# Patient Record
Sex: Female | Born: 1990 | Race: Black or African American | Hispanic: No | Smoking: Current every day smoker
Health system: Southern US, Community
[De-identification: ages and names within clinical notes are randomized; demographics above are authoritative.]

## PROBLEM LIST (undated history)

## (undated) DIAGNOSIS — Z789 Other specified health status: Secondary | ICD-10-CM

---

## 2011-08-06 DIAGNOSIS — Z6836 Body mass index (BMI) 36.0-36.9, adult: Secondary | ICD-10-CM | POA: Insufficient documentation

## 2015-04-20 DIAGNOSIS — Z880 Allergy status to penicillin: Secondary | ICD-10-CM | POA: Insufficient documentation

## 2015-04-29 DIAGNOSIS — R768 Other specified abnormal immunological findings in serum: Secondary | ICD-10-CM | POA: Insufficient documentation

## 2016-10-12 ENCOUNTER — Encounter: Payer: Self-pay | Admitting: Certified Nurse Midwife

## 2016-10-20 ENCOUNTER — Encounter: Payer: Medicaid Other | Admitting: Medical

## 2016-11-15 ENCOUNTER — Encounter: Payer: Self-pay | Admitting: *Deleted

## 2016-11-15 ENCOUNTER — Encounter: Payer: Medicaid Other | Admitting: Medical

## 2016-11-15 NOTE — Progress Notes (Signed)
Judy Quinn did not keep her scheduled appointment for birth control. Per discussion with Vonzella NippleJulie Wenzel, PA she  does not need to be called, may reschedule if she calls.

## 2016-12-19 ENCOUNTER — Encounter: Payer: Medicaid Other | Admitting: Medical

## 2017-02-12 ENCOUNTER — Ambulatory Visit: Payer: Medicaid Other | Admitting: Podiatry

## 2017-02-12 DIAGNOSIS — M79676 Pain in unspecified toe(s): Secondary | ICD-10-CM

## 2017-02-12 DIAGNOSIS — L603 Nail dystrophy: Secondary | ICD-10-CM

## 2017-02-12 DIAGNOSIS — B351 Tinea unguium: Secondary | ICD-10-CM | POA: Diagnosis not present

## 2017-02-12 MED ORDER — TERBINAFINE HCL 250 MG PO TABS: 250.0000 mg | ORAL_TABLET | Freq: Every day | ORAL | 0 refills | Status: DC

## 2017-02-12 NOTE — Progress Notes (Signed)
   Subjective:    Patient ID: Judy Quinn, female    DOB: 1990/03/11, 27 y.o.   MRN: [id number]  HPI    Review of Systems  All other systems reviewed and are negative.      Objective:   Physical Exam        Assessment & Plan:

## 2017-02-14 NOTE — Progress Notes (Signed)
   Subjective: 27 year old female presenting today as a new patient with a chief complaint of thickening and discoloration of the right great toenail that began about one month ago. She reports associated pain and tenderness of the nail. She states she had an acrylic nail applied to the toenail and believes part of it may still be present. Wearing heels increases the pain. She has not done anything to treat the symptoms. Patient presents today for further treatment and evaluation.  No past medical history on file.  Objective: Physical Exam General: The patient is alert and oriented x3 in no acute distress.  Dermatology: Hyperkeratotic, discolored, thickened, onychodystrophy of the right great toenail. Skin is warm, dry and supple bilateral lower extremities. Negative for open lesions or macerations.  Vascular: Palpable pedal pulses bilaterally. No edema or erythema noted. Capillary refill within normal limits.  Neurological: Epicritic and protective threshold grossly intact bilaterally.   Musculoskeletal Exam: Range of motion within normal limits to all pedal and ankle joints bilateral. Muscle strength 5/5 in all groups bilateral.   Assessment: #1 dystrophic nail right great toe #2 Onychomycosis of nail due to dermatophyte right great toe #3 Pain in right foot  Plan of Care:  #1 Patient was evaluated. #2 Mechanical debridement of right great toenail performed using a nail nipper. Filed with dremel without incident.  #3 Prescription for Lamisil 250 mg #90 provided to patient. #4 Explained that it may take up to one year for nail to grow out.  #5 Return to clinic as needed.   Felecia ShellingBrent M. Maranda Marte, DPM [facility]  Dr. Felecia ShellingBrent M. Francisco Ostrovsky, DPM    [address]                                        [place], [place] [address]                Office [phone]  Fax [phone]

## 2017-04-10 DIAGNOSIS — H5213 Myopia, bilateral: Secondary | ICD-10-CM | POA: Diagnosis not present

## 2017-04-10 DIAGNOSIS — H52223 Regular astigmatism, bilateral: Secondary | ICD-10-CM | POA: Diagnosis not present

## 2017-04-23 DIAGNOSIS — H04123 Dry eye syndrome of bilateral lacrimal glands: Secondary | ICD-10-CM | POA: Diagnosis not present

## 2017-05-09 ENCOUNTER — Encounter (HOSPITAL_COMMUNITY): Payer: Self-pay | Admitting: Emergency Medicine

## 2017-05-09 ENCOUNTER — Ambulatory Visit (HOSPITAL_COMMUNITY)
Admission: EM | Admit: 2017-05-09 | Discharge: 2017-05-09 | Disposition: A | Discharge status: Home / Self Care | Payer: Medicaid Other | Attending: Family Medicine | Admitting: Family Medicine

## 2017-05-09 DIAGNOSIS — L249 Irritant contact dermatitis, unspecified cause: Secondary | ICD-10-CM | POA: Diagnosis not present

## 2017-05-09 MED ORDER — TRIAMCINOLONE ACETONIDE 0.1 % EX CREA: 1.0000 "application " | TOPICAL_CREAM | Freq: Two times a day (BID) | CUTANEOUS | 1 refills | Status: DC

## 2017-05-09 NOTE — ED Triage Notes (Signed)
Pt sts rash to back of neck

## 2017-05-17 NOTE — ED Provider Notes (Signed)
  [facility]   [id number] 05/09/17 Arrival Time: 1314  ASSESSMENT & PLAN:  1. Irritant contact dermatitis, unspecified trigger     Meds ordered this encounter  Medications  . triamcinolone cream (KENALOG) 0.1 %    Sig: Apply 1 application topically 2 (two) times daily.    Dispense:  30 g    Refill:  1   Unsure of trigger. Questions hair products. Will stop using for now. Will follow up with PCP or here if worsening or failing to improve as anticipated. Reviewed expectations re: course of current medical issues. Questions answered. Outlined signs and symptoms indicating need for more acute intervention. Patient verbalized understanding. After Visit Summary given.   SUBJECTIVE:  Judy Quinn is a 27 y.o. female who presents with a skin complaint.   Location: posterior neck Onset: gradual Duration: a few weeks Pruritic? somewhat at times Painful? No Progression: stable  Drainage? No  Known trigger? questions hair products Contacts with similar? No Recent travel? No  Other associated symptoms: none Therapies tried thus far: none Denies fever. No specific aggravating or alleviating factors reported.   ROS: As per HPI.  OBJECTIVE: Vitals:   05/09/17 1332  BP: (!) 143/95  Pulse: 80  Resp: 18  Temp: 98.1 F (36.7 C)  TempSrc: Oral  SpO2: 100%    General appearance: alert; no distress Lungs: clear to auscultation bilaterally Heart: regular rate and rhythm Extremities: no edema Skin: warm and dry; irritated skin over posterior neck at hairline; appears inflammatory and consistent with a contact dermatitis Psychological: alert and cooperative; normal mood and affect  No Known Allergies   Social History   Socioeconomic History  . Marital status: Single    Spouse name: Not on file  . Number of children: Not on file  . Years of education: Not on file  . Highest education level: Not on file  Occupational History  . Not on file  Social Needs    . Financial resource strain: Not on file  . Food insecurity:    Worry: Not on file    Inability: Not on file  . Transportation needs:    Medical: Not on file    Non-medical: Not on file  Tobacco Use  . Smoking status: Not on file  Substance and Sexual Activity  . Alcohol use: Not on file  . Drug use: Not on file  . Sexual activity: Not on file  Lifestyle  . Physical activity:    Days per week: Not on file    Minutes per session: Not on file  . Stress: Not on file  Relationships  . Social connections:    Talks on phone: Not on file    Gets together: Not on file    Attends religious service: Not on file    Active member of club or organization: Not on file    Attends meetings of clubs or organizations: Not on file    Relationship status: Not on file  . Intimate partner violence:    Fear of current or ex partner: Not on file    Emotionally abused: Not on file    Physically abused: Not on file    Forced sexual activity: Not on file  Other Topics Concern  . Not on file  Social History Narrative  . Not on file   History reviewed. No pertinent family history.    Mardella Layman, MD 05/17/17 [phone]

## 2017-05-24 ENCOUNTER — Ambulatory Visit (HOSPITAL_COMMUNITY)
Admission: EM | Admit: 2017-05-24 | Discharge: 2017-05-24 | Disposition: A | Discharge status: Home / Self Care | Payer: Medicaid Other | Attending: Family Medicine | Admitting: Family Medicine

## 2017-05-24 ENCOUNTER — Encounter (HOSPITAL_COMMUNITY): Payer: Self-pay | Admitting: Family Medicine

## 2017-05-24 DIAGNOSIS — J020 Streptococcal pharyngitis: Secondary | ICD-10-CM

## 2017-05-24 DIAGNOSIS — J029 Acute pharyngitis, unspecified: Secondary | ICD-10-CM | POA: Diagnosis not present

## 2017-05-24 LAB — POCT RAPID STREP A: STREPTOCOCCUS, GROUP A SCREEN (DIRECT): POSITIVE — AB

## 2017-05-24 MED ORDER — AMOXICILLIN 500 MG PO CAPS: 500.0000 mg | ORAL_CAPSULE | Freq: Two times a day (BID) | ORAL | 0 refills | Status: AC

## 2017-05-24 MED ORDER — IBUPROFEN 800 MG PO TABS: 800.0000 mg | ORAL_TABLET | Freq: Three times a day (TID) | ORAL | 0 refills | Status: DC

## 2017-05-24 NOTE — ED Provider Notes (Signed)
[facility]    CSN: [id number] Arrival date & time: 05/24/17  1019     History   Chief Complaint Chief Complaint  Patient presents with  . Sore Throat    HPI Judy Quinn is a 27 y.o. female presenting today for evaluation of sore throat.  Patient has had a sore throat and fever since yesterday.  Symptoms began all of a sudden.  Symptoms have continued to worsen into today.  Is endorsing some mild associated congestion, minimal cough.  Taking Tylenol and ibuprofen for pain.  Denies any nausea or vomiting.  HPI  History reviewed. No pertinent past medical history.  Patient Active Problem List   Diagnosis Date Noted  . 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 Penicillin allergy 04/20/2015  . Body mass index (BMI) of 36.0-36.9 in adult 08/06/2011    History reviewed. No pertinent surgical history.  OB History   None      Home Medications    Prior to Admission medications   Medication Sig Start Date End Date Taking? Authorizing Provider  amoxicillin (AMOXIL) 500 MG capsule Take 1 capsule (500 mg total) by mouth 2 (two) times daily for 10 days. 05/24/17 06/03/17  Janelis Stelzer C, PA-C  ibuprofen (ADVIL,MOTRIN) 800 MG tablet Take 1 tablet (800 mg total) by mouth 3 (three) times daily. 05/24/17   Gustavo Dispenza C, PA-C  terbinafine (LAMISIL) 250 MG tablet Take 1 tablet (250 mg total) by mouth daily. 02/12/17   Felecia Shelling, DPM  triamcinolone cream (KENALOG) 0.1 % Apply 1 application topically 2 (two) times daily. 05/09/17   Mardella Layman, MD    Family History History reviewed. No pertinent family history.  Social History Social History   Tobacco Use  . Smoking status: Never Smoker  . Smokeless tobacco: Never Used  Substance Use Topics  . Alcohol use: Not on file  . Drug use: Not on file     Allergies   Patient has no known allergies.   Review of Systems Review of Systems  Constitutional: Negative for chills, fatigue and fever.  HENT:  Positive for congestion, rhinorrhea, sore throat, trouble swallowing and voice change. Negative for ear pain and sinus pressure.   Respiratory: Negative for cough, chest tightness and shortness of breath.   Cardiovascular: Negative for chest pain.  Gastrointestinal: Negative for abdominal pain, nausea and vomiting.  Musculoskeletal: Negative for myalgias.  Skin: Negative for rash.  Neurological: Negative for dizziness, light-headedness and headaches.     Physical Exam Triage Vital Signs ED Triage Vitals [05/24/17 1103]  Enc Vitals Group     BP (!) 145/78     Pulse Rate (!) 107     Resp 18     Temp 99 F (37.2 C)     Temp src      SpO2 100 %     Weight      Height      Head Circumference      Peak Flow      Pain Score      Pain Loc      Pain Edu?      Excl. in GC?    No data found.  Updated Vital Signs BP (!) 145/78   Pulse (!) 107   Temp 99 F (37.2 C)   Resp 18   LMP 05/10/2017   SpO2 100%   Visual Acuity Right Eye Distance:   Left Eye Distance:   Bilateral Distance:    Right Eye Near:  Left Eye Near:    Bilateral Near:     Physical Exam  Constitutional: She appears well-developed and well-nourished. No distress.  HENT:  Head: Normocephalic and atraumatic.  Bilateral TMs not erythematous, nasal mucosa nonerythematous without rhinorrhea, posterior oropharynx erythematous, moderate tonsillar enlargement bilaterally, no exudate, uvula swelling or deviation.  Eyes: Conjunctivae and EOM are normal.  Neck: Normal range of motion. Neck supple.  Cardiovascular: Normal rate and regular rhythm.  No murmur heard. Pulmonary/Chest: Effort normal and breath sounds normal. No respiratory distress.  Breathing comfortably at rest, CTA BL  Abdominal: Soft. There is no tenderness.  Musculoskeletal: She exhibits no edema.  Neurological: She is alert.  Skin: Skin is warm and dry.  Psychiatric: She has a normal mood and affect.  Nursing note and vitals  reviewed.    UC Treatments / Results  Labs (all labs ordered are listed, but only abnormal results are displayed) Labs Reviewed  POCT RAPID STREP A - Abnormal; Notable for the following components:      Result Value   Streptococcus, Group A Screen (Direct) POSITIVE (*)    All other components within normal limits    EKG None  Radiology No results found.  Procedures Procedures (including critical care time)  Medications Ordered in UC Medications - No data to display  Initial Impression / Assessment and Plan / UC Course  I have reviewed the triage vital signs and the nursing notes.  Pertinent labs & imaging results that were available during my care of the patient were reviewed by me and considered in my medical decision making (see chart for details).     Patient tested positive for strep. No evidence of peritonsillar abscess or retropharyngeal abscess. Patient is nontoxic appearing, no drooling, dysphagia, muffled voice, or tripoding. No trismus.  Amoxicillin prescribed, Tylenol and ibuprofen.  Recommended Zyrtec and Flonase for congestion.Discussed strict return precautions. Patient verbalized understanding and is agreeable with plan.  Final Clinical Impressions(s) / UC Diagnoses   Final diagnoses:  Strep pharyngitis     Discharge Instructions     Sore Throat   Your rapid strep test was positive today. We will treat you for strep throat with an antibiotic. Please take Amoxicillin as prescribed.   Please continue Tylenol or Ibuprofen for fever and pain. May try salt water gargles, cepacol lozenges, throat spray, or OTC cold relief medicine for throat discomfort. If you also have congestion take a daily anti-histamine like Zyrtec, Claritin, and a oral decongestant to help with post nasal drip that may be irritating your throat.   Stay hydrated and drink plenty of fluids to keep your throat coated relieve irritation.     ED Prescriptions    Medication Sig Dispense  Auth. Provider   amoxicillin (AMOXIL) 500 MG capsule Take 1 capsule (500 mg total) by mouth 2 (two) times daily for 10 days. 20 capsule Viktoriya Glaspy C, PA-C   ibuprofen (ADVIL,MOTRIN) 800 MG tablet Take 1 tablet (800 mg total) by mouth 3 (three) times daily. 21 tablet Falyn Rubel, [place] C, PA-C     Controlled Substance Prescriptions [place] Controlled Substance Registry consulted? Not Applicable   Lew Dawes, [place] 05/24/17 2350

## 2017-05-24 NOTE — ED Triage Notes (Signed)
Pt here for sore throat since yesterday.

## 2017-05-24 NOTE — Discharge Instructions (Addendum)

## 2017-08-09 DIAGNOSIS — Z113 Encounter for screening for infections with a predominantly sexual mode of transmission: Secondary | ICD-10-CM | POA: Diagnosis not present

## 2017-08-09 DIAGNOSIS — Z3A13 13 weeks gestation of pregnancy: Secondary | ICD-10-CM | POA: Diagnosis not present

## 2017-08-09 DIAGNOSIS — O3680X9 Pregnancy with inconclusive fetal viability, other fetus: Secondary | ICD-10-CM | POA: Diagnosis not present

## 2017-08-09 DIAGNOSIS — N925 Other specified irregular menstruation: Secondary | ICD-10-CM | POA: Diagnosis not present

## 2017-08-21 DIAGNOSIS — Z3A15 15 weeks gestation of pregnancy: Secondary | ICD-10-CM | POA: Diagnosis not present

## 2017-08-21 DIAGNOSIS — Z3482 Encounter for supervision of other normal pregnancy, second trimester: Secondary | ICD-10-CM | POA: Diagnosis not present

## 2017-08-21 LAB — OB RESULTS CONSOLE RUBELLA ANTIBODY, IGM: RUBELLA: IMMUNE

## 2017-08-21 LAB — OB RESULTS CONSOLE ABO/RH: RH Type: POSITIVE

## 2017-08-21 LAB — OB RESULTS CONSOLE ANTIBODY SCREEN: Antibody Screen: NEGATIVE

## 2017-08-21 LAB — OB RESULTS CONSOLE GC/CHLAMYDIA
CHLAMYDIA, DNA PROBE: NEGATIVE
Gonorrhea: NEGATIVE

## 2017-08-21 LAB — OB RESULTS CONSOLE RPR: RPR: NONREACTIVE

## 2017-08-21 LAB — OB RESULTS CONSOLE HIV ANTIBODY (ROUTINE TESTING): HIV: NONREACTIVE

## 2017-08-21 LAB — OB RESULTS CONSOLE HEPATITIS B SURFACE ANTIGEN: Hepatitis B Surface Ag: NEGATIVE

## 2017-08-23 ENCOUNTER — Other Ambulatory Visit (HOSPITAL_COMMUNITY): Payer: Self-pay | Admitting: Obstetrics and Gynecology

## 2017-08-23 DIAGNOSIS — Z87898 Personal history of other specified conditions: Secondary | ICD-10-CM

## 2017-08-23 DIAGNOSIS — O360922 Maternal care for other rhesus isoimmunization, second trimester, fetus 2: Secondary | ICD-10-CM

## 2017-08-23 DIAGNOSIS — Z363 Encounter for antenatal screening for malformations: Secondary | ICD-10-CM

## 2017-08-23 DIAGNOSIS — Z3A17 17 weeks gestation of pregnancy: Secondary | ICD-10-CM

## 2017-08-29 ENCOUNTER — Encounter (HOSPITAL_COMMUNITY): Payer: Self-pay

## 2017-08-31 ENCOUNTER — Ambulatory Visit (HOSPITAL_COMMUNITY): Payer: Medicaid Other

## 2017-08-31 ENCOUNTER — Encounter (HOSPITAL_COMMUNITY): Payer: Medicaid Other

## 2017-09-03 ENCOUNTER — Encounter (HOSPITAL_COMMUNITY): Payer: Self-pay | Admitting: *Deleted

## 2017-09-05 ENCOUNTER — Ambulatory Visit (HOSPITAL_COMMUNITY)
Admission: RE | Admit: 2017-09-05 | Discharge: 2017-09-05 | Disposition: A | Discharge status: Home / Self Care | Payer: Medicaid Other | Source: Ambulatory Visit | Attending: Obstetrics and Gynecology | Admitting: Obstetrics and Gynecology

## 2017-09-05 ENCOUNTER — Encounter (HOSPITAL_COMMUNITY): Payer: Medicaid Other

## 2017-09-14 ENCOUNTER — Ambulatory Visit (HOSPITAL_COMMUNITY)
Admission: RE | Admit: 2017-09-14 | Discharge: 2017-09-14 | Disposition: A | Discharge status: Home / Self Care | Payer: Medicaid Other | Source: Ambulatory Visit | Attending: Obstetrics and Gynecology | Admitting: Obstetrics and Gynecology

## 2017-09-14 ENCOUNTER — Encounter (HOSPITAL_COMMUNITY): Payer: Medicaid Other

## 2017-09-19 DIAGNOSIS — Z3A19 19 weeks gestation of pregnancy: Secondary | ICD-10-CM | POA: Diagnosis not present

## 2017-09-19 DIAGNOSIS — Z363 Encounter for antenatal screening for malformations: Secondary | ICD-10-CM | POA: Diagnosis not present

## 2017-09-21 ENCOUNTER — Encounter (HOSPITAL_COMMUNITY): Payer: Self-pay

## 2017-09-28 ENCOUNTER — Ambulatory Visit (HOSPITAL_COMMUNITY)
Admission: RE | Admit: 2017-09-28 | Discharge: 2017-09-28 | Disposition: A | Discharge status: Home / Self Care | Payer: Medicaid Other | Source: Ambulatory Visit | Attending: Obstetrics and Gynecology | Admitting: Obstetrics and Gynecology

## 2017-09-28 ENCOUNTER — Encounter (HOSPITAL_COMMUNITY): Payer: Medicaid Other

## 2017-10-08 ENCOUNTER — Encounter (HOSPITAL_COMMUNITY): Payer: Self-pay

## 2017-10-08 ENCOUNTER — Ambulatory Visit (HOSPITAL_COMMUNITY)
Admission: RE | Admit: 2017-10-08 | Discharge: 2017-10-08 | Disposition: A | Discharge status: Home / Self Care | Payer: Medicaid Other | Source: Ambulatory Visit | Attending: Obstetrics and Gynecology | Admitting: Obstetrics and Gynecology

## 2017-10-08 ENCOUNTER — Ambulatory Visit (HOSPITAL_BASED_OUTPATIENT_CLINIC_OR_DEPARTMENT_OTHER)
Admission: RE | Admit: 2017-10-08 | Discharge: 2017-10-08 | Disposition: A | Discharge status: Home / Self Care | Payer: Medicaid Other | Source: Ambulatory Visit | Attending: Obstetrics and Gynecology | Admitting: Obstetrics and Gynecology

## 2017-10-08 ENCOUNTER — Other Ambulatory Visit (HOSPITAL_COMMUNITY): Payer: Self-pay | Admitting: *Deleted

## 2017-10-08 DIAGNOSIS — O99212 Obesity complicating pregnancy, second trimester: Secondary | ICD-10-CM | POA: Diagnosis not present

## 2017-10-08 DIAGNOSIS — Z363 Encounter for antenatal screening for malformations: Secondary | ICD-10-CM | POA: Diagnosis not present

## 2017-10-08 DIAGNOSIS — O09213 Supervision of pregnancy with history of pre-term labor, third trimester: Secondary | ICD-10-CM | POA: Diagnosis not present

## 2017-10-08 DIAGNOSIS — O09292 Supervision of pregnancy with other poor reproductive or obstetric history, second trimester: Secondary | ICD-10-CM

## 2017-10-08 DIAGNOSIS — O34219 Maternal care for unspecified type scar from previous cesarean delivery: Secondary | ICD-10-CM

## 2017-10-08 DIAGNOSIS — O09293 Supervision of pregnancy with other poor reproductive or obstetric history, third trimester: Secondary | ICD-10-CM

## 2017-10-08 DIAGNOSIS — O360922 Maternal care for other rhesus isoimmunization, second trimester, fetus 2: Secondary | ICD-10-CM

## 2017-10-08 DIAGNOSIS — Z8759 Personal history of other complications of pregnancy, childbirth and the puerperium: Secondary | ICD-10-CM

## 2017-10-08 DIAGNOSIS — Z3A22 22 weeks gestation of pregnancy: Secondary | ICD-10-CM | POA: Diagnosis not present

## 2017-10-08 DIAGNOSIS — O09212 Supervision of pregnancy with history of pre-term labor, second trimester: Secondary | ICD-10-CM | POA: Insufficient documentation

## 2017-10-08 DIAGNOSIS — Z3A17 17 weeks gestation of pregnancy: Secondary | ICD-10-CM

## 2017-10-08 DIAGNOSIS — Z87898 Personal history of other specified conditions: Secondary | ICD-10-CM

## 2017-10-08 HISTORY — DX: Other specified health status: Z78.9

## 2017-10-08 NOTE — Consult Note (Signed)
Maternal-Fetal Medicine Patient's Name: Judy Quinn MRN: [id number] Requesting Provider: Nigel Bridgeman, CNM  [facility] the [facility]. She is a G3 P0202 at 22-weeks' gestation, is here for fetal anatomy scan and consultation. Obstetric history is significant for fetal growth restriction.  Obstetric history: -06/2011: Classical cesarean delivery of a female infant weighing 1 lb 6 oz. at 26-weeks' gestation at [facility].  Patient reports that she was told that her baby had growth restriction and "abnormal blood flow." She was admitted at 53 weeks' gestation for about 3 days. Classical cesarean section was performed because of nonreassuring fetal heart trace ("baby's heart rate dropped"). The newborn stayed in the NICU for about 4 months. Now, her son has cerebral palsy. She did not have hypertension or diabetes in that pregnancy.  -05/2015: Repeat cesarean delivery at ?36 weeks' gestation. Patient reports that she was told that her baby had anemia. Her son weighed 4-8 at birth and he is in good health now.  Gyn history: No history of cervical surgeries. PMH: No history of diabetes or hypertension or any other chronic medical conditions. She does not have sickle-cell trait. Anti-E antibodies were found on screening, but recent repeat sample showed no anti-E antibodies. PSH: Two cesarean sections (including classical cesarean).  Medications: Prenatal vitamins, calcium, vitamin D. Allergies: NKDA. Social: Ex-smoker before pregnancy (2 cigarettes daily). No tobacco or alcohol or drug use now. Her partner (father of her current and second child) is an Tree surgeon and he is in good health. He has sickle-cell trait. Family: No history of venous thromboembolism in the family.  Prenatal care: Her pregnancy is well-dated by early ultrasound. On cell-free fetal DNA screening, the risks of fetal aneuploidies are not increased. MSAFP  screening showed low risk for open-neural tube defects.  On ultrasound, no markers of fetal aneuploidies or fetal structural defects are seen. Fetal biometry is consistent with her previously-established dates. Amniotic fluid is normal and good fetal activity is seen. Placenta is posterior and there is no evidence of previa or accreta.  Our concerns include: History of fetal growth restriction: We discussed possible causes of growth restriction including chromosomal anomalies and maternal medical conditions. Patient does not have any medical conditions. Placental insufficiency seems to be the most-likely cause of growth restriction. Recurrent fetal growth restriction can occur in up to 20% of cases and we recommend serial fetal growth assessments to identify growth restriction early in pregnancy. I do not recommend thrombophilia work-up as the relationship with growth restriction is very tenuous. Also, low-dose aspirin or heparin is not recommended.  I am unable to determine the exact reason for the provider's decision to deliver her second child at 36 weeks (?). Anti-E antibodies can cause fetal anemia. However, the infant was discharged with the mother and did not have postnatal blood transfusion. I recommend we try to obtain her previous prenatal/delivery records.  Classical cesarean section:  Recommended time of delivery is at 37 weeks to prevent scar rupture. Repeat cesarean deliveries increase the risk of placenta previa and accreta. It is reassuring to note that the placental position is normal (posterior).  -An appointment was made for her to return in 4 weeks for fetal growth assessment. -Serial fetal growth assessment every 4 weeks that may be performed at your office. I recommend that growth assessments are performed at a single institution to monitor interval growth. -Repeat screening for antibodies before her next visit (Anti-E antibodies).  Thank  you for your consult. Please do not  hesitate to contact me if you have any questions or concerns.  Consultation including face-to-face counseling: 40 min.

## 2017-10-17 ENCOUNTER — Other Ambulatory Visit: Payer: Self-pay

## 2017-11-05 ENCOUNTER — Ambulatory Visit (HOSPITAL_COMMUNITY): Payer: Medicaid Other

## 2017-11-15 DIAGNOSIS — Z8759 Personal history of other complications of pregnancy, childbirth and the puerperium: Secondary | ICD-10-CM | POA: Diagnosis not present

## 2017-11-15 DIAGNOSIS — Z363 Encounter for antenatal screening for malformations: Secondary | ICD-10-CM | POA: Diagnosis not present

## 2017-11-15 DIAGNOSIS — Z3A28 28 weeks gestation of pregnancy: Secondary | ICD-10-CM | POA: Diagnosis not present

## 2017-11-15 DIAGNOSIS — D582 Other hemoglobinopathies: Secondary | ICD-10-CM | POA: Diagnosis not present

## 2017-11-22 ENCOUNTER — Other Ambulatory Visit: Payer: Self-pay | Admitting: Obstetrics and Gynecology

## 2017-11-29 ENCOUNTER — Encounter (HOSPITAL_COMMUNITY): Payer: Self-pay

## 2017-12-25 ENCOUNTER — Encounter (HOSPITAL_COMMUNITY): Payer: Self-pay | Admitting: *Deleted

## 2017-12-25 ENCOUNTER — Inpatient Hospital Stay (HOSPITAL_COMMUNITY)
Admission: AD | Admit: 2017-12-25 | Discharge: 2017-12-25 | Disposition: A | Discharge status: Home / Self Care | Payer: Medicaid Other | Source: Ambulatory Visit | Attending: Obstetrics and Gynecology | Admitting: Obstetrics and Gynecology

## 2017-12-25 DIAGNOSIS — R197 Diarrhea, unspecified: Secondary | ICD-10-CM | POA: Diagnosis not present

## 2017-12-25 DIAGNOSIS — R112 Nausea with vomiting, unspecified: Secondary | ICD-10-CM | POA: Diagnosis present

## 2017-12-25 DIAGNOSIS — Z3A33 33 weeks gestation of pregnancy: Secondary | ICD-10-CM | POA: Diagnosis not present

## 2017-12-25 DIAGNOSIS — O26893 Other specified pregnancy related conditions, third trimester: Secondary | ICD-10-CM

## 2017-12-25 DIAGNOSIS — Z87891 Personal history of nicotine dependence: Secondary | ICD-10-CM | POA: Insufficient documentation

## 2017-12-25 DIAGNOSIS — R109 Unspecified abdominal pain: Secondary | ICD-10-CM | POA: Insufficient documentation

## 2017-12-25 DIAGNOSIS — Z880 Allergy status to penicillin: Secondary | ICD-10-CM | POA: Insufficient documentation

## 2017-12-25 DIAGNOSIS — O9989 Other specified diseases and conditions complicating pregnancy, childbirth and the puerperium: Secondary | ICD-10-CM | POA: Diagnosis not present

## 2017-12-25 DIAGNOSIS — O212 Late vomiting of pregnancy: Secondary | ICD-10-CM | POA: Insufficient documentation

## 2017-12-25 DIAGNOSIS — O219 Vomiting of pregnancy, unspecified: Secondary | ICD-10-CM

## 2017-12-25 DIAGNOSIS — O26899 Other specified pregnancy related conditions, unspecified trimester: Secondary | ICD-10-CM | POA: Diagnosis present

## 2017-12-25 LAB — URINALYSIS, ROUTINE W REFLEX MICROSCOPIC
BILIRUBIN URINE: NEGATIVE
Glucose, UA: NEGATIVE mg/dL
Hgb urine dipstick: NEGATIVE
KETONES UR: NEGATIVE mg/dL
Leukocytes, UA: NEGATIVE
Nitrite: NEGATIVE
PROTEIN: NEGATIVE mg/dL
Specific Gravity, Urine: 1.029 (ref 1.005–1.030)
pH: 5 (ref 5.0–8.0)

## 2017-12-25 MED ORDER — LACTATED RINGERS IV BOLUS
1000.0000 mL | Freq: Once | INTRAVENOUS | Status: AC
  Administered 2017-12-25: 1000 mL via INTRAVENOUS

## 2017-12-25 MED ORDER — SODIUM CHLORIDE 0.9 % IV SOLN
8.0000 mg | Freq: Once | INTRAVENOUS | Status: AC
  Administered 2017-12-25: 8 mg via INTRAVENOUS
  Filled 2017-12-25: qty 4

## 2017-12-25 MED ORDER — ONDANSETRON 4 MG PO TBDP: 4.0000 mg | ORAL_TABLET | Freq: Three times a day (TID) | ORAL | 0 refills | Status: DC | PRN

## 2017-12-25 MED ORDER — LOPERAMIDE HCL 2 MG PO CAPS: 4.0000 mg | ORAL_CAPSULE | Freq: Once | ORAL | Status: DC

## 2017-12-25 NOTE — MAU Note (Signed)
Pt reports cramping that started around 11p States she got up, walked around and thought she needed to have a BM. Tried to use the bathroom but was unsuccessful. Unsure if she is having contractions or is constipated. Last BM was Sunday. Pt denies vaginal bleeding or LOF. Reports good fetal movement.

## 2017-12-25 NOTE — MAU Provider Note (Signed)
History     CSN: [id number]  Arrival date and time: 12/25/17 0102   First Provider Initiated Contact with Patient 12/25/17 0144      Chief Complaint  Patient presents with  . Contractions   HPI  Ms.  Judy Quinn is a 27 y.o. year old G3P0202 female at [redacted]w[redacted]d weeks gestation who presents to MAU reporting abdominal pain/cramping since 2300. She tried walking and having a BM. Nothing she tried at home seemed to work. She reports having diarrhea on Sunday 12/23/17, but she is concerned she has constipation or contractions tonight. She reports that she had not had a BM for 2 wks prior to Sunday. She denies N/V, VB or LOF. She reports good (+) FM. She receives [facility] with [facility], but did not call before coming to MAU. Her next appt with [facility] is Tuesday 12/25/17 at 0900.  Past Medical History:  Diagnosis Date  . Medical history non-contributory     Past Surgical History:  Procedure Laterality Date  . CESAREAN SECTION      History reviewed. No pertinent family history.  Social History   Tobacco Use  . Smoking status: Former Games developer  . Smokeless tobacco: Never Used  Substance Use Topics  . Alcohol use: Not Currently  . Drug use: Never    Allergies:  Allergies  Allergen Reactions  . Penicillins     Pt had a PCN allergy test at [facility] which was negative.  No allergy to PCN     Medications Prior to Admission  Medication Sig Dispense Refill Last Dose  . Prenatal Vit-Fe Fumarate-FA (PRENATAL VITAMIN PO) Take by mouth.   12/24/2017 at Unknown time  . ibuprofen (ADVIL,MOTRIN) 800 MG tablet Take 1 tablet (800 mg total) by mouth 3 (three) times daily. (Patient not taking: Reported on 10/08/2017) 21 tablet 0 Not Taking  . terbinafine (LAMISIL) 250 MG tablet Take 1 tablet (250 mg total) by mouth daily. (Patient not taking: Reported on 10/08/2017) 90 tablet 0 Not Taking  . triamcinolone cream (KENALOG) 0.1 % Apply 1 application topically 2 (two) times daily. (Patient not taking: Reported on  10/08/2017) 30 g 1 Not Taking    Review of Systems  Constitutional: Negative.   HENT: Negative.   Eyes: Negative.   Respiratory: Negative.   Cardiovascular: Negative.   Gastrointestinal: Positive for abdominal pain.  Endocrine: Negative.   Genitourinary: Negative.   Musculoskeletal: Negative.   Skin: Negative.   Allergic/Immunologic: Negative.   Neurological: Negative.   Hematological: Negative.   Psychiatric/Behavioral: Negative.    Physical Exam   Blood pressure 139/86, pulse 97, temperature 98.5 F (36.9 C), temperature source Oral, resp. rate 18, height 5\' 4"  (1.626 m), weight 111.1 kg, last menstrual period 05/28/2017, SpO2 100 %.  Physical Exam  Nursing note and vitals reviewed. Constitutional: She is oriented to person, place, and time. She appears well-developed and well-nourished.  HENT:  Head: Normocephalic and atraumatic.  Eyes: Pupils are equal, round, and reactive to light.  Neck: Normal range of motion.  Cardiovascular: Normal rate, regular rhythm and normal heart sounds.  Respiratory: Effort normal and breath sounds normal.  GI: Soft. Bowel sounds are normal. There is tenderness (mild tenderness with palpation in upper abdomen).  Genitourinary:  Genitourinary Comments: Dilation: Closed Effacement (%): Thick Exam by: Raelyn Mora CNM    Musculoskeletal: Normal range of motion.  Neurological: She is alert and oriented to person, place, and time.  Skin: Skin is warm and dry.  Psychiatric: She has a normal mood and  affect. Her behavior is normal. Judgment and thought content normal.    MAU Course  Procedures  MDM [facility] LR 1000 mg bolus Zofran 8 mg IVPB -- improved nausea and vomiting  NST - FHR: 135 bpm / moderate variability / accels present / decels absent / TOCO: none Patient did get up a couple of times to attempt to have a BM -- she reports "watery stool"  0243 patient actively vomiting in BR -- IVFs and Zofran ordered  Results for orders  placed or performed during the hospital encounter of 12/25/17 (from the past 24 hour(s))  Urinalysis, Routine w reflex microscopic     Status: Abnormal   Collection Time: 12/25/17  2:07 AM  Result Value Ref Range   Color, Urine YELLOW YELLOW   APPearance HAZY (A) CLEAR   Specific Gravity, Urine 1.029 1.005 - 1.030   pH 5.0 5.0 - 8.0   Glucose, UA NEGATIVE NEGATIVE mg/dL   Hgb urine dipstick NEGATIVE NEGATIVE   Bilirubin Urine NEGATIVE NEGATIVE   Ketones, ur NEGATIVE NEGATIVE mg/dL   Protein, ur NEGATIVE NEGATIVE mg/dL   Nitrite NEGATIVE NEGATIVE   Leukocytes, UA NEGATIVE NEGATIVE    Assessment and Plan  Nausea vomiting and diarrhea - Rx for Zofran 4 mg every 8 hrs prn N/V - Information provided on N/V/D   Abdominal pain affecting pregnancy - Reassurance given that abdominal pain is a result of N/V/D  - Discharge patient - Keep scheduled appt with [facility] on 12/25/17 @ 0900 - Patient verbalized an understanding of the plan of care and agrees.   Raelyn Moraolitta Bianco Cange, MSN, CNM 12/25/2017, 2:32 AM

## 2018-01-01 ENCOUNTER — Telehealth (HOSPITAL_COMMUNITY): Payer: Self-pay | Admitting: *Deleted

## 2018-01-01 NOTE — Telephone Encounter (Signed)
Preadmission screen  

## 2018-01-03 ENCOUNTER — Telehealth (HOSPITAL_COMMUNITY): Payer: Self-pay | Admitting: *Deleted

## 2018-01-03 NOTE — Telephone Encounter (Signed)
Preadmission screen  

## 2018-01-04 ENCOUNTER — Telehealth (HOSPITAL_COMMUNITY): Payer: Self-pay | Admitting: *Deleted

## 2018-01-04 NOTE — Telephone Encounter (Signed)
Preadmission screen  

## 2018-01-07 ENCOUNTER — Telehealth (HOSPITAL_COMMUNITY): Payer: Self-pay | Admitting: *Deleted

## 2018-01-07 DIAGNOSIS — O99213 Obesity complicating pregnancy, third trimester: Secondary | ICD-10-CM | POA: Diagnosis not present

## 2018-01-07 DIAGNOSIS — Z3A35 35 weeks gestation of pregnancy: Secondary | ICD-10-CM | POA: Diagnosis not present

## 2018-01-07 NOTE — Telephone Encounter (Signed)
Preadmission screen  

## 2018-01-08 DIAGNOSIS — Z3A35 35 weeks gestation of pregnancy: Secondary | ICD-10-CM | POA: Diagnosis not present

## 2018-01-08 DIAGNOSIS — Z23 Encounter for immunization: Secondary | ICD-10-CM | POA: Diagnosis not present

## 2018-01-08 DIAGNOSIS — Z3403 Encounter for supervision of normal first pregnancy, third trimester: Secondary | ICD-10-CM | POA: Diagnosis not present

## 2018-01-10 ENCOUNTER — Telehealth (HOSPITAL_COMMUNITY): Payer: Self-pay | Admitting: *Deleted

## 2018-01-10 NOTE — Telephone Encounter (Signed)
Preadmission screen  

## 2018-01-11 ENCOUNTER — Telehealth (HOSPITAL_COMMUNITY): Payer: Self-pay | Admitting: *Deleted

## 2018-01-11 NOTE — Telephone Encounter (Signed)
Preadmission screen  

## 2018-01-14 ENCOUNTER — Encounter (HOSPITAL_COMMUNITY): Payer: Self-pay

## 2018-01-14 ENCOUNTER — Telehealth (HOSPITAL_COMMUNITY): Payer: Self-pay | Admitting: *Deleted

## 2018-01-14 NOTE — Telephone Encounter (Signed)
Preadmission screen  

## 2018-01-15 ENCOUNTER — Encounter (HOSPITAL_COMMUNITY)
Admission: RE | Admit: 2018-01-15 | Discharge: 2018-01-15 | Disposition: A | Discharge status: Home / Self Care | Payer: Medicaid Other | Source: Ambulatory Visit

## 2018-01-17 ENCOUNTER — Other Ambulatory Visit: Payer: Self-pay | Admitting: Obstetrics and Gynecology

## 2018-01-17 ENCOUNTER — Inpatient Hospital Stay (HOSPITAL_COMMUNITY)
Admission: RE | Admit: 2018-01-17 | Discharge: 2018-01-17 | DRG: 833 | Disposition: A | Discharge status: Home / Self Care | Payer: Medicaid Other | Attending: Obstetrics and Gynecology | Admitting: Obstetrics and Gynecology

## 2018-01-17 DIAGNOSIS — Z98891 History of uterine scar from previous surgery: Secondary | ICD-10-CM

## 2018-01-17 DIAGNOSIS — Z880 Allergy status to penicillin: Secondary | ICD-10-CM | POA: Diagnosis not present

## 2018-01-17 DIAGNOSIS — Z87891 Personal history of nicotine dependence: Secondary | ICD-10-CM | POA: Diagnosis not present

## 2018-01-17 DIAGNOSIS — O99213 Obesity complicating pregnancy, third trimester: Secondary | ICD-10-CM | POA: Diagnosis present

## 2018-01-17 DIAGNOSIS — O34219 Maternal care for unspecified type scar from previous cesarean delivery: Secondary | ICD-10-CM | POA: Diagnosis present

## 2018-01-17 DIAGNOSIS — Z3A37 37 weeks gestation of pregnancy: Secondary | ICD-10-CM | POA: Diagnosis not present

## 2018-01-17 DIAGNOSIS — E669 Obesity, unspecified: Secondary | ICD-10-CM | POA: Diagnosis present

## 2018-01-17 DIAGNOSIS — Z0282 Encounter for adoption services: Secondary | ICD-10-CM | POA: Diagnosis not present

## 2018-01-17 DIAGNOSIS — O99214 Obesity complicating childbirth: Secondary | ICD-10-CM | POA: Diagnosis present

## 2018-01-17 DIAGNOSIS — Z3490 Encounter for supervision of normal pregnancy, unspecified, unspecified trimester: Secondary | ICD-10-CM

## 2018-01-17 LAB — CBC
HCT: 30 % — ABNORMAL LOW (ref 36.0–46.0)
Hemoglobin: 9 g/dL — ABNORMAL LOW (ref 12.0–15.0)
MCH: 23.7 pg — AB (ref 26.0–34.0)
MCHC: 30 g/dL (ref 30.0–36.0)
MCV: 78.9 fL — AB (ref 80.0–100.0)
PLATELETS: 169 10*3/uL (ref 150–400)
RBC: 3.8 MIL/uL — ABNORMAL LOW (ref 3.87–5.11)
RDW: 16.2 % — ABNORMAL HIGH (ref 11.5–15.5)
WBC: 5.2 10*3/uL (ref 4.0–10.5)
nRBC: 0 % (ref 0.0–0.2)

## 2018-01-17 MED ORDER — GENTAMICIN SULFATE 40 MG/ML IJ SOLN
INTRAVENOUS | Status: DC
  Filled 2018-01-17: qty 9.75

## 2018-01-17 MED ORDER — DEXAMETHASONE SODIUM PHOSPHATE 4 MG/ML IJ SOLN
INTRAMUSCULAR | Status: AC
  Filled 2018-01-17: qty 1

## 2018-01-17 MED ORDER — OXYTOCIN 10 UNIT/ML IJ SOLN
INTRAMUSCULAR | Status: AC
  Filled 2018-01-17: qty 4

## 2018-01-17 MED ORDER — PHENYLEPHRINE 8 MG IN D5W 100 ML (0.08MG/ML) PREMIX OPTIME
INJECTION | INTRAVENOUS | Status: AC
  Filled 2018-01-17: qty 100

## 2018-01-17 MED ORDER — ONDANSETRON HCL 4 MG/2ML IJ SOLN
INTRAMUSCULAR | Status: AC
  Filled 2018-01-17: qty 2

## 2018-01-17 NOTE — Anesthesia Preprocedure Evaluation (Addendum)
Anesthesia Evaluation  Patient identified by MRN, date of birth, ID band Patient awake    Reviewed: Allergy & Precautions, NPO status , Patient's Chart, lab work & pertinent test results  History of Anesthesia Complications Negative for: history of anesthetic complications  Airway Mallampati: II  TM Distance: >3 FB Neck ROM: Full    Dental  (+) Dental Advisory Given, Teeth Intact   Pulmonary former smoker,    breath sounds clear to auscultation       Cardiovascular negative cardio ROS   Rhythm:Regular Rate:Normal     Neuro/Psych negative neurological ROS  negative psych ROS   GI/Hepatic negative GI ROS, Neg liver ROS,   Endo/Other  Morbid obesity  Renal/GU negative Renal ROS     Musculoskeletal negative musculoskeletal ROS (+)   Abdominal (+) + obese,   Peds  Hematology negative hematology ROS (+)   Anesthesia Other Findings   Reproductive/Obstetrics (+) Pregnancy                            Anesthesia Physical Anesthesia Plan  ASA: III  Anesthesia Plan: Spinal   Post-op Pain Management:    Induction:   PONV Risk Score and Plan: 2 and Treatment may vary due to age or medical condition, Ondansetron and Scopolamine patch - Pre-op  Airway Management Planned: Natural Airway  Additional Equipment: None  Intra-op Plan:   Post-operative Plan:   Informed Consent: I have reviewed the patients History and Physical, chart, labs and discussed the procedure including the risks, benefits and alternatives for the proposed anesthesia with the patient or authorized representative who has indicated his/her understanding and acceptance.     Plan Discussed with: CRNA and Anesthesiologist  Anesthesia Plan Comments: (Labs reviewed, platelets acceptable. Discussed risks and benefits of spinal, including spinal/epidural hematoma, infection, failed block, and PDPH. Patient expressed  understanding and wished to proceed. )       Anesthesia Quick Evaluation

## 2018-01-17 NOTE — H&P (Signed)
Judy Quinn is a 28 y.o. female presenting for elective repeat cesarean section for history of cesarean section x2 (1 LTCS, 1 classical CS). Patient also has a history of IUGR and preterm birth x2. Patient planning an open adoption for this child.    OB History    Gravida  3   Para  2   Term      Preterm  2   AB      Living  2     SAB      TAB      Ectopic      Multiple      Live Births             Past Medical History:  Diagnosis Date  . Medical history non-contributory    Past Surgical History:  Procedure Laterality Date  . CESAREAN SECTION     Family History: family history includes Hypertension in her mother. Social History:  reports that she has quit smoking. She has never used smokeless tobacco. She reports previous alcohol use. She reports that she does not use drugs.     Maternal Diabetes: No Genetic Screening: Normal Maternal Ultrasounds/Referrals: Normal Fetal Ultrasounds or other Referrals:  Referred to Materal Fetal Medicine , Other: growth ultrasound from 24 weeks q4 weeks for history of IUGR x2, MFM recommended delivery at 37 weeks  Maternal Substance Abuse:  No Significant Maternal Medications:  None Significant Maternal Lab Results:  None Other Comments:  Maternal obesity, BPP weekly from 32 weeks  Review of Systems  All other systems reviewed and are negative.  Maternal Medical History:  Fetal activity: Perceived fetal activity is normal.   Last perceived fetal movement was within the past hour.    Prenatal Complications - Diabetes: none.      Last menstrual period 05/28/2017. Maternal Exam:  Abdomen: Surgical scars: low transverse and low vertical midline.   Fundal height is Size=dates.   Estimated fetal weight is 6.5lbs .   Fetal presentation: vertex     Physical Exam  Vitals reviewed. Constitutional: She is oriented to person, place, and time. She appears well-developed and well-nourished.  HENT:  Head:  Normocephalic.  Eyes: Pupils are equal, round, and reactive to light.  Cardiovascular: Normal rate, regular rhythm and normal heart sounds.  Respiratory: Effort normal and breath sounds normal. No respiratory distress.  GI: There is no abdominal tenderness.  Musculoskeletal: Normal range of motion.  Neurological: She is alert and oriented to person, place, and time.  Skin: Skin is warm and dry.  Psychiatric: She has a normal mood and affect. Her behavior is normal. Judgment and thought content normal.    Prenatal labs: ABO, Rh: O/Positive/-- (08/06 0000) Antibody: Negative (08/06 0000) Rubella: Immune (08/06 0000) RPR: Nonreactive (08/06 0000)  HBsAg: Negative (08/06 0000)  HIV: Non-reactive (08/06 0000)  GBS:   Negative   Assessment/Plan: 28 y.o. G3P2 at [redacted]w[redacted]d Patient plans open adoption History of IUGR x2 with past pregnancies History of cesarean section x2 (1 LTCS, 1 classical CS)- growth q4 weeks from 24 weeks and delivery at 37 weeks per MFM after dose of betamethasone Obesity- Weekly BPP from 32 weeks  Admit to pre-op Repeat cesarean section with Dr. Romeo Rabon Fidela Salisbury 01/17/2018, 11:55 AM

## 2018-01-17 NOTE — Progress Notes (Signed)
Call from RN with question on EDD  Chart reviewed: EDD established per 13+6 week ultrasound and no known LMP EDD: 02/08/18 with today being 36+6 weeks Previous MFM consultation confirmed EDD: 02/08/18 with recommendations of BMZ 1 week prior to elective c/s to be scheduled at 37 weeks Patient with poor compliance and lost to follow-up from 30+ weeks until 01/07/18 and DID NOT receive BMZ  Called MFM and spoke with Dr Judeth CornfieldShankar: recommends to reschedule cesarean section tomorrow at 37 weeks with no need for BMZ at this time.Patient is scheduled 01/18/18 at 11:30 with Dr Su Hiltoberts.

## 2018-01-17 NOTE — Discharge Summary (Signed)
Physician Discharge Summary  Patient ID: Judy Quinn MRN: [id number] DOB/AGE: September 15, 1990 28 y.o.  Admit date: 01/17/2018 Discharge date: 01/17/2018  Admission Diagnoses: Encounter for scheduled repeat cesarean section   Discharge Diagnoses:  Active Problems:   Penicillin allergy   Obesity   Pregnancy with adoption planned   History of classical cesarean section Pregnant and not yet delivered  Discharged Condition: good, stable  Fetal heart rate by doppler: 135  Hospital Course: Patient admitted for planned repeat cesarean section at 37 weeks. On review of patient records, RN noted patient's dating is consistent with [redacted]w[redacted]d. MFM recommends delivery at 37 weeks and not before. Patient's cesarean section rescheduled for tomorrow.   Consults: maternal fetal medicine, Dr. Estanislado Pandy spoke with Dr. Judeth Cornfield per her note   Discharge Instructions    Discharge activity:  No Restrictions   Complete by:  As directed    Discharge diet:  No restrictions   Complete by:  As directed    Fetal Kick Count:  Lie on our left side for one hour after a meal, and count the number of times your baby kicks.  If it is less than 5 times, get up, move around and drink some juice.  Repeat the test 30 minutes later.  If it is still less than 5 kicks in an hour, notify your doctor.   Complete by:  As directed    No sexual activity restrictions   Complete by:  As directed    Notify physician for a general feeling that "something is not right"   Complete by:  As directed    Notify physician for increase or change in vaginal discharge   Complete by:  As directed    Notify physician for intestinal cramps, with or without diarrhea, sometimes described as "gas pain"   Complete by:  As directed    Notify physician for leaking of fluid   Complete by:  As directed    Notify physician for low, dull backache, unrelieved by heat or Tylenol   Complete by:  As directed    Notify physician for menstrual like cramps    Complete by:  As directed    Notify physician for pelvic pressure   Complete by:  As directed    Notify physician for uterine contractions.  These may be painless and feel like the uterus is tightening or the baby is  "balling up"   Complete by:  As directed    Notify physician for vaginal bleeding   Complete by:  As directed    PRETERM LABOR:  Includes any of the follwing symptoms that occur between 20 - [redacted] weeks gestation.  If these symptoms are not stopped, preterm labor can result in preterm delivery, placing your baby at risk   Complete by:  As directed      Allergies as of 01/17/2018   No Known Allergies     Medication List    TAKE these medications   ondansetron 4 MG disintegrating tablet Commonly known as:  ZOFRAN ODT Take 1 tablet (4 mg total) by mouth every 8 (eight) hours as needed for nausea or vomiting.   PRENATAL VITAMIN PO Take 1 tablet by mouth daily.      Follow-up Information    [facility]. Go in 1 day(s).   Specialty:  Obstetrics and Gynecology Why:  Go to scheduled cesarean section appointment in 1 day.  Contact information: [address] [place] [address] [phone]  Signed: Janeece RiggersEllis K Margo Lama 01/17/2018, 12:28 PM

## 2018-01-17 NOTE — Discharge Instructions (Signed)
Preterm Labor and Birth Information °Pregnancy normally lasts 39-41 weeks. Preterm labor is when labor starts early. It starts before you have been pregnant for 37 whole weeks. °What are the risk factors for preterm labor? °Preterm labor is more likely to occur in women who: °· Have an infection while pregnant. °· Have a cervix that is short. °· Have gone into preterm labor before. °· Have had surgery on their cervix. °· Are younger than age 29. °· Are older than age 35. °· Are African American. °· Are pregnant with two or more babies. °· Take street drugs while pregnant. °· Smoke while pregnant. °· Do not gain enough weight while pregnant. °· Got pregnant right after another pregnancy. °What are the symptoms of preterm labor? °Symptoms of preterm labor include: °· Cramps. The cramps may feel like the cramps some women get during their period. The cramps may happen with watery poop (diarrhea). °· Pain in the belly (abdomen). °· Pain in the lower back. °· Regular contractions or tightening. It may feel like your belly is getting tighter. °· Pressure in the lower belly that seems to get stronger. °· More fluid (discharge) leaking from the vagina. The fluid may be watery or bloody. °· Water breaking. °Why is it important to notice signs of preterm labor? °Babies who are born early may not be fully developed. They have a higher chance for: °· Long-term heart problems. °· Long-term lung problems. °· Trouble controlling body systems, like breathing. °· Bleeding in the brain. °· A condition called cerebral palsy. °· Learning difficulties. °· Death. °These risks are highest for babies who are born before 34 weeks of pregnancy. °How is preterm labor treated? °Treatment depends on: °· How long you were pregnant. °· Your condition. °· The health of your baby. °Treatment may involve: °· Having a stitch (suture) placed in your cervix. When you give birth, your cervix opens so the baby can come out. The stitch keeps the cervix  from opening too soon. °· Staying at the hospital. °· Taking or getting medicines, such as: °? Hormone medicines. °? Medicines to stop contractions. °? Medicines to help the baby’s lungs develop. °? Medicines to prevent your baby from having cerebral palsy. °What should I do if I am in preterm labor? °If you think you are going into labor too soon, call your doctor right away. °How can I prevent preterm labor? °· Do not use any tobacco products. °? Examples of these are cigarettes, chewing tobacco, and e-cigarettes. °? If you need help quitting, ask your doctor. °· Do not use street drugs. °· Do not use any medicines unless you ask your doctor if they are safe for you. °· Talk with your doctor before taking any herbal supplements. °· Make sure you gain enough weight. °· Watch for infection. If you think you might have an infection, get it checked right away. °· If you have gone into preterm labor before, tell your doctor. °This information is not intended to replace advice given to you by your health care provider. Make sure you discuss any questions you have with your health care provider. °Document Released: 03/31/2008 Document Revised: 06/15/2015 Document Reviewed: 05/26/2015 °Elsevier Interactive Patient Education © 2019 [employer] ° °

## 2018-01-18 ENCOUNTER — Inpatient Hospital Stay (HOSPITAL_COMMUNITY)
Admission: RE | Admit: 2018-01-18 | Discharge: 2018-01-20 | DRG: 788 | Disposition: A | Discharge status: Home / Self Care | Payer: Medicaid Other | Attending: Obstetrics and Gynecology | Admitting: Obstetrics and Gynecology

## 2018-01-18 ENCOUNTER — Other Ambulatory Visit: Payer: Self-pay

## 2018-01-18 ENCOUNTER — Encounter (HOSPITAL_COMMUNITY)
Admission: RE | Disposition: A | Discharge status: Home / Self Care | Payer: Self-pay | Source: Home / Self Care | Attending: Obstetrics and Gynecology

## 2018-01-18 ENCOUNTER — Inpatient Hospital Stay (HOSPITAL_COMMUNITY): Payer: Medicaid Other | Admitting: Anesthesiology

## 2018-01-18 ENCOUNTER — Encounter (HOSPITAL_COMMUNITY): Payer: Self-pay | Admitting: *Deleted

## 2018-01-18 DIAGNOSIS — Z87891 Personal history of nicotine dependence: Secondary | ICD-10-CM | POA: Diagnosis not present

## 2018-01-18 DIAGNOSIS — Z3490 Encounter for supervision of normal pregnancy, unspecified, unspecified trimester: Secondary | ICD-10-CM

## 2018-01-18 DIAGNOSIS — O34212 Maternal care for vertical scar from previous cesarean delivery: Principal | ICD-10-CM | POA: Diagnosis present

## 2018-01-18 DIAGNOSIS — O9902 Anemia complicating childbirth: Secondary | ICD-10-CM | POA: Diagnosis present

## 2018-01-18 DIAGNOSIS — Z0282 Encounter for adoption services: Secondary | ICD-10-CM | POA: Diagnosis not present

## 2018-01-18 DIAGNOSIS — Z98891 History of uterine scar from previous surgery: Secondary | ICD-10-CM

## 2018-01-18 DIAGNOSIS — O34211 Maternal care for low transverse scar from previous cesarean delivery: Secondary | ICD-10-CM | POA: Diagnosis present

## 2018-01-18 DIAGNOSIS — Z3A37 37 weeks gestation of pregnancy: Secondary | ICD-10-CM

## 2018-01-18 DIAGNOSIS — E669 Obesity, unspecified: Secondary | ICD-10-CM | POA: Diagnosis present

## 2018-01-18 DIAGNOSIS — O99214 Obesity complicating childbirth: Secondary | ICD-10-CM | POA: Diagnosis present

## 2018-01-18 DIAGNOSIS — O34219 Maternal care for unspecified type scar from previous cesarean delivery: Secondary | ICD-10-CM | POA: Diagnosis not present

## 2018-01-18 DIAGNOSIS — Z880 Allergy status to penicillin: Secondary | ICD-10-CM

## 2018-01-18 LAB — RPR: RPR: NONREACTIVE

## 2018-01-18 SURGERY — Surgical Case
Anesthesia: Spinal

## 2018-01-18 MED ORDER — DIPHENHYDRAMINE HCL 25 MG PO CAPS: 25.0000 mg | ORAL_CAPSULE | ORAL | Status: DC | PRN

## 2018-01-18 MED ORDER — NALBUPHINE HCL 10 MG/ML IJ SOLN
5.0000 mg | Freq: Once | INTRAMUSCULAR | Status: AC | PRN
  Administered 2018-01-19: 5 mg via INTRAVENOUS

## 2018-01-18 MED ORDER — ZOLPIDEM TARTRATE 5 MG PO TABS: 5.0000 mg | ORAL_TABLET | Freq: Every evening | ORAL | Status: DC | PRN

## 2018-01-18 MED ORDER — OXYTOCIN 10 UNIT/ML IJ SOLN
INTRAVENOUS | Status: DC | PRN
  Administered 2018-01-18: 40 [IU] via INTRAVENOUS

## 2018-01-18 MED ORDER — NALOXONE HCL 0.4 MG/ML IJ SOLN: 0.4000 mg | INTRAMUSCULAR | Status: DC | PRN

## 2018-01-18 MED ORDER — ONDANSETRON HCL 4 MG/2ML IJ SOLN
INTRAMUSCULAR | Status: DC | PRN
  Administered 2018-01-18: 4 mg via INTRAVENOUS

## 2018-01-18 MED ORDER — LACTATED RINGERS IV SOLN
INTRAVENOUS | Status: DC
  Administered 2018-01-19 (×2): via INTRAVENOUS

## 2018-01-18 MED ORDER — ONDANSETRON HCL 4 MG/2ML IJ SOLN: 4.0000 mg | Freq: Three times a day (TID) | INTRAMUSCULAR | Status: DC | PRN

## 2018-01-18 MED ORDER — NALBUPHINE HCL 10 MG/ML IJ SOLN: 5.0000 mg | INTRAMUSCULAR | Status: DC | PRN

## 2018-01-18 MED ORDER — SODIUM CHLORIDE 0.9% FLUSH: 3.0000 mL | INTRAVENOUS | Status: DC | PRN

## 2018-01-18 MED ORDER — GENTAMICIN SULFATE 40 MG/ML IJ SOLN
INTRAVENOUS | Status: DC | PRN
  Administered 2018-01-18: 116 mL via INTRAVENOUS

## 2018-01-18 MED ORDER — MENTHOL 3 MG MT LOZG: 1.0000 | LOZENGE | OROMUCOSAL | Status: DC | PRN

## 2018-01-18 MED ORDER — SENNOSIDES-DOCUSATE SODIUM 8.6-50 MG PO TABS
2.0000 | ORAL_TABLET | ORAL | Status: DC
  Administered 2018-01-18 – 2018-01-19 (×2): 2 via ORAL
  Filled 2018-01-18 (×2): qty 2

## 2018-01-18 MED ORDER — MORPHINE SULFATE (PF) 0.5 MG/ML IJ SOLN
INTRAMUSCULAR | Status: AC
  Filled 2018-01-18: qty 10

## 2018-01-18 MED ORDER — DIPHENHYDRAMINE HCL 25 MG PO CAPS
25.0000 mg | ORAL_CAPSULE | Freq: Four times a day (QID) | ORAL | Status: DC | PRN
  Filled 2018-01-18: qty 1

## 2018-01-18 MED ORDER — NALBUPHINE HCL 10 MG/ML IJ SOLN: 5.0000 mg | Freq: Once | INTRAMUSCULAR | Status: DC | PRN

## 2018-01-18 MED ORDER — OXYCODONE HCL 5 MG PO TABS
5.0000 mg | ORAL_TABLET | ORAL | Status: DC | PRN
  Administered 2018-01-18 – 2018-01-19 (×2): 5 mg via ORAL
  Administered 2018-01-19 – 2018-01-20 (×5): 10 mg via ORAL
  Filled 2018-01-18: qty 2
  Filled 2018-01-18: qty 1
  Filled 2018-01-18 (×3): qty 2
  Filled 2018-01-18: qty 1
  Filled 2018-01-18: qty 2

## 2018-01-18 MED ORDER — PHENYLEPHRINE 40 MCG/ML (10ML) SYRINGE FOR IV PUSH (FOR BLOOD PRESSURE SUPPORT)
PREFILLED_SYRINGE | INTRAVENOUS | Status: AC
  Filled 2018-01-18: qty 10

## 2018-01-18 MED ORDER — DEXAMETHASONE SODIUM PHOSPHATE 4 MG/ML IJ SOLN
INTRAMUSCULAR | Status: AC
  Filled 2018-01-18: qty 1

## 2018-01-18 MED ORDER — NALOXONE HCL 4 MG/10ML IJ SOLN: 1.0000 ug/kg/h | INTRAVENOUS | Status: DC | PRN

## 2018-01-18 MED ORDER — SOD CITRATE-CITRIC ACID 500-334 MG/5ML PO SOLN
30.0000 mL | Freq: Once | ORAL | Status: AC
  Administered 2018-01-18: 30 mL via ORAL
  Filled 2018-01-18: qty 15

## 2018-01-18 MED ORDER — SIMETHICONE 80 MG PO CHEW: 80.0000 mg | CHEWABLE_TABLET | ORAL | Status: DC | PRN

## 2018-01-18 MED ORDER — NALBUPHINE HCL 10 MG/ML IJ SOLN
5.0000 mg | INTRAMUSCULAR | Status: DC | PRN
  Filled 2018-01-18: qty 1

## 2018-01-18 MED ORDER — LACTATED RINGERS IV SOLN
INTRAVENOUS | Status: DC
  Administered 2018-01-18 (×2): via INTRAVENOUS

## 2018-01-18 MED ORDER — DIBUCAINE 1 % RE OINT: 1.0000 "application " | TOPICAL_OINTMENT | RECTAL | Status: DC | PRN

## 2018-01-18 MED ORDER — DIPHENHYDRAMINE HCL 50 MG/ML IJ SOLN
12.5000 mg | INTRAMUSCULAR | Status: DC | PRN
  Administered 2018-01-18: 12.5 mg via INTRAVENOUS

## 2018-01-18 MED ORDER — FENTANYL CITRATE (PF) 100 MCG/2ML IJ SOLN
INTRAMUSCULAR | Status: DC | PRN
  Administered 2018-01-18: 15 ug via INTRATHECAL

## 2018-01-18 MED ORDER — WITCH HAZEL-GLYCERIN EX PADS: 1.0000 "application " | MEDICATED_PAD | CUTANEOUS | Status: DC | PRN

## 2018-01-18 MED ORDER — MEPERIDINE HCL 25 MG/ML IJ SOLN: 6.2500 mg | INTRAMUSCULAR | Status: DC | PRN

## 2018-01-18 MED ORDER — SIMETHICONE 80 MG PO CHEW
80.0000 mg | CHEWABLE_TABLET | Freq: Three times a day (TID) | ORAL | Status: DC
  Administered 2018-01-19 – 2018-01-20 (×5): 80 mg via ORAL
  Filled 2018-01-18 (×5): qty 1

## 2018-01-18 MED ORDER — SIMETHICONE 80 MG PO CHEW
80.0000 mg | CHEWABLE_TABLET | ORAL | Status: DC
  Administered 2018-01-18 – 2018-01-19 (×2): 80 mg via ORAL
  Filled 2018-01-18 (×2): qty 1

## 2018-01-18 MED ORDER — PHENYLEPHRINE 8 MG IN D5W 100 ML (0.08MG/ML) PREMIX OPTIME
INJECTION | INTRAVENOUS | Status: AC
  Filled 2018-01-18: qty 100

## 2018-01-18 MED ORDER — TETANUS-DIPHTH-ACELL PERTUSSIS 5-2.5-18.5 LF-MCG/0.5 IM SUSP: 0.5000 mL | Freq: Once | INTRAMUSCULAR | Status: DC

## 2018-01-18 MED ORDER — OXYTOCIN 10 UNIT/ML IJ SOLN
INTRAMUSCULAR | Status: AC
  Filled 2018-01-18: qty 4

## 2018-01-18 MED ORDER — KETOROLAC TROMETHAMINE 30 MG/ML IJ SOLN
30.0000 mg | Freq: Once | INTRAMUSCULAR | Status: AC | PRN
  Administered 2018-01-18: 30 mg via INTRAVENOUS

## 2018-01-18 MED ORDER — PHENYLEPHRINE 8 MG IN D5W 100 ML (0.08MG/ML) PREMIX OPTIME
INJECTION | INTRAVENOUS | Status: DC | PRN
  Administered 2018-01-18: 50 ug/min via INTRAVENOUS

## 2018-01-18 MED ORDER — BUPIVACAINE IN DEXTROSE 0.75-8.25 % IT SOLN
INTRATHECAL | Status: DC | PRN
  Administered 2018-01-18: 1.6 mL via INTRATHECAL

## 2018-01-18 MED ORDER — SCOPOLAMINE 1 MG/3DAYS TD PT72
1.0000 | MEDICATED_PATCH | Freq: Once | TRANSDERMAL | Status: DC
  Administered 2018-01-18: 1.5 mg via TRANSDERMAL
  Filled 2018-01-18: qty 1

## 2018-01-18 MED ORDER — PRENATAL MULTIVITAMIN CH
1.0000 | ORAL_TABLET | Freq: Every day | ORAL | Status: DC
  Administered 2018-01-19 – 2018-01-20 (×2): 1 via ORAL
  Filled 2018-01-18 (×2): qty 1

## 2018-01-18 MED ORDER — LACTATED RINGERS IV SOLN
INTRAVENOUS | Status: DC | PRN
  Administered 2018-01-18: 14:00:00 via INTRAVENOUS

## 2018-01-18 MED ORDER — FAMOTIDINE 20 MG PO TABS
20.0000 mg | ORAL_TABLET | Freq: Once | ORAL | Status: AC
  Administered 2018-01-18: 20 mg via ORAL
  Filled 2018-01-18: qty 1

## 2018-01-18 MED ORDER — KETOROLAC TROMETHAMINE 30 MG/ML IJ SOLN: 30.0000 mg | Freq: Four times a day (QID) | INTRAMUSCULAR | Status: AC | PRN

## 2018-01-18 MED ORDER — DIPHENHYDRAMINE HCL 50 MG/ML IJ SOLN
INTRAMUSCULAR | Status: AC
  Filled 2018-01-18: qty 1

## 2018-01-18 MED ORDER — ACETAMINOPHEN 325 MG PO TABS
650.0000 mg | ORAL_TABLET | ORAL | Status: DC | PRN
  Administered 2018-01-18 – 2018-01-20 (×4): 650 mg via ORAL
  Filled 2018-01-18 (×4): qty 2

## 2018-01-18 MED ORDER — MORPHINE SULFATE (PF) 0.5 MG/ML IJ SOLN
INTRAMUSCULAR | Status: DC | PRN
  Administered 2018-01-18: .15 mg via INTRATHECAL

## 2018-01-18 MED ORDER — ONDANSETRON HCL 4 MG/2ML IJ SOLN
INTRAMUSCULAR | Status: AC
  Filled 2018-01-18: qty 2

## 2018-01-18 MED ORDER — NALBUPHINE HCL 10 MG/ML IJ SOLN
5.0000 mg | INTRAMUSCULAR | Status: DC | PRN
  Administered 2018-01-18 – 2018-01-19 (×3): 5 mg via SUBCUTANEOUS
  Filled 2018-01-18 (×3): qty 1

## 2018-01-18 MED ORDER — DIPHENHYDRAMINE HCL 50 MG/ML IJ SOLN: 12.5000 mg | INTRAMUSCULAR | Status: DC | PRN

## 2018-01-18 MED ORDER — OXYTOCIN 40 UNITS IN LACTATED RINGERS INFUSION - SIMPLE MED: 2.5000 [IU]/h | INTRAVENOUS | Status: AC

## 2018-01-18 MED ORDER — COCONUT OIL OIL: 1.0000 "application " | TOPICAL_OIL | Status: DC | PRN

## 2018-01-18 MED ORDER — DEXTROSE 5 % IV SOLN: INTRAVENOUS | Status: DC | PRN

## 2018-01-18 MED ORDER — KETOROLAC TROMETHAMINE 30 MG/ML IJ SOLN
INTRAMUSCULAR | Status: AC
  Filled 2018-01-18: qty 1

## 2018-01-18 MED ORDER — PROMETHAZINE HCL 25 MG/ML IJ SOLN: 6.2500 mg | INTRAMUSCULAR | Status: DC | PRN

## 2018-01-18 MED ORDER — SCOPOLAMINE 1 MG/3DAYS TD PT72: 1.0000 | MEDICATED_PATCH | Freq: Once | TRANSDERMAL | Status: DC

## 2018-01-18 MED ORDER — NALOXONE HCL 4 MG/10ML IJ SOLN
1.0000 ug/kg/h | INTRAVENOUS | Status: DC | PRN
  Filled 2018-01-18: qty 5

## 2018-01-18 MED ORDER — FENTANYL CITRATE (PF) 100 MCG/2ML IJ SOLN
INTRAMUSCULAR | Status: AC
  Filled 2018-01-18: qty 2

## 2018-01-18 MED ORDER — DIPHENHYDRAMINE HCL 25 MG PO CAPS
25.0000 mg | ORAL_CAPSULE | ORAL | Status: DC | PRN
  Administered 2018-01-18 – 2018-01-19 (×4): 25 mg via ORAL
  Filled 2018-01-18 (×4): qty 1

## 2018-01-18 MED ORDER — OXYCODONE HCL 5 MG/5ML PO SOLN: 5.0000 mg | Freq: Once | ORAL | Status: DC | PRN

## 2018-01-18 MED ORDER — OXYCODONE HCL 5 MG PO TABS: 5.0000 mg | ORAL_TABLET | Freq: Once | ORAL | Status: DC | PRN

## 2018-01-18 MED ORDER — HYDROMORPHONE HCL 1 MG/ML IJ SOLN: 0.2500 mg | INTRAMUSCULAR | Status: DC | PRN

## 2018-01-18 MED ORDER — DEXAMETHASONE SODIUM PHOSPHATE 4 MG/ML IJ SOLN
INTRAMUSCULAR | Status: DC | PRN
  Administered 2018-01-18: 4 mg via INTRAVENOUS

## 2018-01-18 MED ORDER — SODIUM CHLORIDE 0.9 % IR SOLN
Status: DC | PRN
  Administered 2018-01-18: 1000 mL

## 2018-01-18 MED ORDER — NALBUPHINE HCL 10 MG/ML IJ SOLN: 5.0000 mg | Freq: Once | INTRAMUSCULAR | Status: AC | PRN

## 2018-01-18 SURGICAL SUPPLY — 37 items
BENZOIN TINCTURE PRP APPL 2/3 (GAUZE/BANDAGES/DRESSINGS) ×3 IMPLANT
BOOTIES KNEE HIGH SLOAN (MISCELLANEOUS) ×6 IMPLANT
CHLORAPREP W/TINT 26ML (MISCELLANEOUS) ×3 IMPLANT
CLAMP CORD UMBIL (MISCELLANEOUS) IMPLANT
CLOSURE WOUND 1/2 X4 (GAUZE/BANDAGES/DRESSINGS) ×1
CLOTH BEACON ORANGE TIMEOUT ST (SAFETY) ×3 IMPLANT
DRAIN JACKSON PRT FLT 10 (DRAIN) IMPLANT
DRSG OPSITE POSTOP 4X10 (GAUZE/BANDAGES/DRESSINGS) ×3 IMPLANT
ELECT REM PT RETURN 9FT ADLT (ELECTROSURGICAL) ×3
ELECTRODE REM PT RTRN 9FT ADLT (ELECTROSURGICAL) ×1 IMPLANT
EVACUATOR SILICONE 100CC (DRAIN) IMPLANT
EXTRACTOR VACUUM M CUP 4 TUBE (SUCTIONS) IMPLANT
EXTRACTOR VACUUM M CUP 4' TUBE (SUCTIONS)
GLOVE BIOGEL PI IND STRL 7.0 (GLOVE) ×2 IMPLANT
GLOVE BIOGEL PI INDICATOR 7.0 (GLOVE) ×4
GLOVE ECLIPSE 6.5 STRL STRAW (GLOVE) ×3 IMPLANT
GOWN STRL REUS W/TWL LRG LVL3 (GOWN DISPOSABLE) ×6 IMPLANT
KIT ABG SYR 3ML LUER SLIP (SYRINGE) IMPLANT
NEEDLE HYPO 22GX1.5 SAFETY (NEEDLE) ×3 IMPLANT
NEEDLE HYPO 25X5/8 SAFETYGLIDE (NEEDLE) IMPLANT
NS IRRIG 1000ML POUR BTL (IV SOLUTION) ×6 IMPLANT
PACK C SECTION WH (CUSTOM PROCEDURE TRAY) ×3 IMPLANT
PAD OB MATERNITY 4.3X12.25 (PERSONAL CARE ITEMS) ×3 IMPLANT
PENCIL SMOKE EVAC W/HOLSTER (ELECTROSURGICAL) ×3 IMPLANT
RTRCTR C-SECT PINK 25CM LRG (MISCELLANEOUS) ×3 IMPLANT
SPONGE LAP 18X18 RF (DISPOSABLE) ×9 IMPLANT
STRIP CLOSURE SKIN 1/2X4 (GAUZE/BANDAGES/DRESSINGS) ×2 IMPLANT
SUT MNCRL AB 3-0 PS2 27 (SUTURE) ×3 IMPLANT
SUT SILK 2 0 FSL 18 (SUTURE) IMPLANT
SUT VIC AB 0 CTX 36 (SUTURE) ×6
SUT VIC AB 0 CTX36XBRD ANBCTRL (SUTURE) ×3 IMPLANT
SUT VIC AB 1 CT1 36 (SUTURE) ×6 IMPLANT
SUT VIC AB 2-0 CT1 27 (SUTURE)
SUT VIC AB 2-0 CT1 TAPERPNT 27 (SUTURE) IMPLANT
SYR 20CC LL (SYRINGE) ×3 IMPLANT
TOWEL OR 17X24 6PK STRL BLUE (TOWEL DISPOSABLE) ×3 IMPLANT
TRAY FOLEY W/BAG SLVR 14FR LF (SET/KITS/TRAYS/PACK) ×3 IMPLANT

## 2018-01-18 NOTE — H&P (Signed)
Judy Quinn is a 28 y.o. female presenting for elective repeat cesarean section for history of cesarean section x2 (1 LTCS, 1 classical CS). Patient also has a history of IUGR and preterm birth x2. Patient planning an open adoption for this child.    OB History    Gravida  3   Para  2   Term      Preterm  2   AB      Living  2     SAB      TAB      Ectopic      Multiple      Live Births             Past Medical History:  Diagnosis Date  . Medical history non-contributory    Past Surgical History:  Procedure Laterality Date  . CESAREAN SECTION     Family History: family history includes Hypertension in her mother. Social History:  reports that she has quit smoking. She has never used smokeless tobacco. She reports previous alcohol use. She reports that she does not use drugs.     Maternal Diabetes: No Genetic Screening: Normal Maternal Ultrasounds/Referrals: Normal Fetal Ultrasounds or other Referrals:  Referred to Materal Fetal Medicine , Other: growth ultrasound from 24 weeks q4 weeks for history of IUGR x2, MFM recommended delivery at 37 weeks  Maternal Substance Abuse:  No Significant Maternal Medications:  None Significant Maternal Lab Results:  None Other Comments:  Maternal obesity, BPP weekly from 32 weeks  Review of Systems  All other systems reviewed and are negative.  Maternal Medical History:  Fetal activity: Perceived fetal activity is normal.   Last perceived fetal movement was within the past hour.    Prenatal Complications - Diabetes: none.      Last menstrual period 05/28/2017.   Maternal Exam:  Abdomen: Surgical scars: low transverse and low vertical midline.   Fundal height is Size=dates.   Estimated fetal weight is 6.5lbs .   Fetal presentation: vertex     Physical Exam  Vitals reviewed. Constitutional: She is oriented to person, place, and time. She appears well-developed and well-nourished.  HENT:  Head:  Normocephalic.  Eyes: Pupils are equal, round, and reactive to light.  Cardiovascular: Normal rate, regular rhythm and normal heart sounds.  Respiratory: Effort normal and breath sounds normal. No respiratory distress.  GI: There is no abdominal tenderness.  Musculoskeletal: Normal range of motion.  Neurological: She is alert and oriented to person, place, and time.  Skin: Skin is warm and dry.  Psychiatric: She has a normal mood and affect. Her behavior is normal. Judgment and thought content normal.    Prenatal labs: ABO, Rh: --/--/O POS (01/02 1210) Antibody: POS (01/02 1210) Rubella: Immune (08/06 0000) RPR: Non Reactive (01/02 1210)  HBsAg: Negative (08/06 0000)  HIV: Non-reactive (08/06 0000)  GBS:   Negative   Assessment/Plan: 28 y.o. G3P2 at 37w Patient plans open adoption History of IUGR x2 with past pregnancies History of cesarean section x2 (1 LTCS, 1 classical CS)- growth q4 weeks from 24 weeks and delivery at 37 weeks per MFM after dose of betamethasone Obesity- Weekly BPP from 32 weeks  Admit to pre-op Repeat cesarean section with Dr. Romeo Rabon Fidela Salisbury 01/18/2018, 8:52 AM

## 2018-01-18 NOTE — Op Note (Signed)
Cesarean Section Procedure Note  Indications: P2 at 37wks with h/o classical c-section scheduled for repeat c-section.  Baby is going for adoption and adopting couple is here.  Pre-operative Diagnosis: 1.37wks 2.History of Classical Cesarean Section   Post-operative Diagnosis: 1.37wks 2.History of Classical Cesarean Section  Procedure: REPEAT CESAREAN SECTION  Surgeon: Osborn Coho, MD    Assistants: Kathalene Frames, CNM  Anesthesia: Regional  Procedure Details  The patient was taken to the operating room secondary to repeat c-section after the risks, benefits, complications, treatment options, and expected outcomes were discussed with the patient.  The patient concurred with the proposed plan, giving informed consent which was signed and witnessed. The patient was taken to Operating Room Nine, identified as Judy Quinn and the procedure verified as C-Section Delivery. A Time Out was held and the above information confirmed.  After induction of anesthesia by obtaining a spinal, the patient was prepped and draped in the usual sterile manner. A Pfannenstiel skin incision was made and carried down through the subcutaneous tissue to the underlying layer of fascia.  The fascia was incised bilaterally and extended transversely bilaterally with the Mayo scissors. Kocher clamps were placed on the inferior aspect of the fascial incision and the underlying rectus muscle was separated from the fascia. The same was done on the superior aspect of the fascial incision.  The peritoneum was identified, entered bluntly and extended manually.  An Alexis self-retaining retractor was placed.  The utero-vesical peritoneal reflection was incised transversely and the bladder flap was bluntly freed from the lower uterine segment. A low transverse uterine incision was made with the scalpel and extended bilaterally with the bandage scissors.  The infant was delivered in vertex position without difficulty.  After the  umbilical cord was clamped and cut, the infant was handed to the awaiting pediatricians.  Cord blood was obtained for evaluation.  The placenta was removed intact and appeared to be within normal limits. The uterus was cleared of all clots and debris. The uterine incision was closed with running interlocking sutures of 0 Vicryl and a second imbricating layer was performed as well.   Bilateral tubes and ovaries appeared to be within normal limits.  Good hemostasis was noted.  Copious irrigation was performed until clear.  The peritoneum was repaired with 2-0 chromic via a running suture.  The fascia was reapproximated with a running suture of 0 Vicryl. The subcutaneous tissue was reapproximated with 3 interrupted sutures of 2-0 plain.  The skin was reapproximated with a subcuticular suture of 3-0 monocryl.  Steristrips were applied.  Instrument, sponge, and needle counts were correct prior to abdominal closure and at the conclusion of the case.  The patient was awaiting transfer to the recovery room in good condition.  Findings: Live female infant with Apgars pending.  Baby went to NICU.  Normal appearing bilateral ovaries and fallopian tubes were noted.  Estimated Blood Loss:  172 ml         Drains: foley to gravity 75cc urine         Total IV Fluids: 2500 ml         Specimens to Pathology: None (placenta to L&D)         Complications:  None; patient tolerated the procedure well.         Disposition: PACU - hemodynamically stable.         Condition: stable  Attending Attestation: I performed the procedure.

## 2018-01-18 NOTE — Transfer of Care (Signed)
Immediate Anesthesia Transfer of Care Note  Patient: Judy Quinn  Procedure(s) Performed: REPEAT CESAREAN SECTION (N/A )  Patient Location: PACU  Anesthesia Type:Spinal  Level of Consciousness: awake, alert  and oriented  Airway & Oxygen Therapy: Patient Spontanous Breathing  Post-op Assessment: Report given to RN and Post -op Vital signs reviewed and stable  Post vital signs: Reviewed and stable  Last Vitals:  Vitals Value Taken Time  BP    Temp    Pulse    Resp    SpO2      Last Pain:  Vitals:   01/18/18 1024  TempSrc: Oral         Complications: No apparent anesthesia complications

## 2018-01-18 NOTE — Anesthesia Procedure Notes (Signed)
Spinal  Patient location during procedure: OB Start time: 01/18/2018 12:48 PM End time: 01/18/2018 12:53 PM Staffing Anesthesiologist: Lowella Curb, MD Performed: anesthesiologist  Preanesthetic Checklist Completed: patient identified, surgical consent, pre-op evaluation, timeout performed, IV checked, risks and benefits discussed and monitors and equipment checked Spinal Block Patient position: sitting Prep: site prepped and draped and DuraPrep Patient monitoring: heart rate, cardiac monitor, continuous pulse ox and blood pressure Approach: midline Location: L3-4 Injection technique: single-shot Needle Needle type: Pencan  Needle gauge: 24 G Needle length: 10 cm Assessment Sensory level: T4

## 2018-01-18 NOTE — H&P (Signed)
After further review, pt did not actually receive betamethasone per MFMs initial recs d/t poor follow up and Dr. Estanislado Pandy spoke with Dr. Judeth Cornfield yesterday who said it was not necessary and to do c/s today at 37wks.  Please see Dr. Cloretta Ned note from yesterday.

## 2018-01-18 NOTE — Interval H&P Note (Signed)
History and Physical Interval Note:  01/18/2018 12:15 PM  Judy Quinn  has presented today for surgery, with the diagnosis of History of Classical Cesarean Section  The various methods of treatment have been discussed with the patient and family. After consideration of risks, benefits and other options for treatment, the patient has consented to  Procedure(s): REPEAT CESAREAN SECTION (N/A) as a surgical intervention .  The patient's history has been reviewed, patient examined, no change in status, stable for surgery.  I have reviewed the patient's chart and labs.  Questions were answered to the patient's satisfaction.     Purcell Nails

## 2018-01-18 NOTE — Anesthesia Postprocedure Evaluation (Signed)
Anesthesia Post Note  Patient: Judy Quinn  Procedure(s) Performed: REPEAT CESAREAN SECTION (N/A )     Patient location during evaluation: PACU Anesthesia Type: Spinal Level of consciousness: oriented and awake and alert Pain management: pain level controlled Vital Signs Assessment: post-procedure vital signs reviewed and stable Respiratory status: spontaneous breathing and respiratory function stable Cardiovascular status: blood pressure returned to baseline and stable Postop Assessment: no headache, no backache and no apparent nausea or vomiting Anesthetic complications: no    Last Vitals:  Vitals:   01/18/18 1500 01/18/18 1515  BP: 122/72 128/84  Pulse: 71 66  Resp: 19 15  Temp:  (!) 36.3 C  SpO2: 98% 99%    Last Pain:  Vitals:   01/18/18 1515  TempSrc: Oral   Pain Goal:                 Lowella Curb

## 2018-01-18 NOTE — Progress Notes (Signed)
CSW met with MOB briefly in room 158 to review birth plan regarding adoption. When CSW arrived, MOB was resting in bed and adopting parents were on the couch.  With MOB's permission, CSW asked adopting parents to leave the room in order to meet with MOB in private. MOB was polite, receptive to meeting with CSW, and appeared to have insight and awareness.   MOB acknowledged having an adoption plan and reported that she has been working with [employer].  MOB also identified Lamarr Lulas as MOB's attorney, and reported her attorney will be present tomorrow to sign all official adoption documents.   CSW made MOB aware of hospital' policy regarding room assignment for adopting parents (Adopting parents cannot be assigned a room with infant until all adoption documents have been signed with an attorney). MOB was understanding and communicated that MOB wants infant to remain in the room with MOB until adoption documents are signed.  MOB also stated that MOB is comfortable with having adopting parents in the room with MOB and infant.    CSW made MOB aware that if MOB wants to change plan, MOB should communicate her desires to her bedside nurse.  MOB was encouraged to ask for "Pineapple Juice" if MOB wishes to speak with bedside nurse in private.  CSW plans to meet with MOB after L&D to complete clinical assessment.   Laurey Arrow, MSW, LCSW Clinical Social Work [phone]

## 2018-01-19 LAB — CBC
HCT: 25.4 % — ABNORMAL LOW (ref 36.0–46.0)
HEMOGLOBIN: 7.9 g/dL — AB (ref 12.0–15.0)
MCH: 24.5 pg — ABNORMAL LOW (ref 26.0–34.0)
MCHC: 31.1 g/dL (ref 30.0–36.0)
MCV: 78.6 fL — ABNORMAL LOW (ref 80.0–100.0)
Platelets: 178 10*3/uL (ref 150–400)
RBC: 3.23 MIL/uL — ABNORMAL LOW (ref 3.87–5.11)
RDW: 16.4 % — ABNORMAL HIGH (ref 11.5–15.5)
WBC: 8.1 10*3/uL (ref 4.0–10.5)
nRBC: 0 % (ref 0.0–0.2)

## 2018-01-19 LAB — BIRTH TISSUE RECOVERY COLLECTION (PLACENTA DONATION)

## 2018-01-19 MED ORDER — FERROUS SULFATE 325 (65 FE) MG PO TABS
325.0000 mg | ORAL_TABLET | Freq: Every day | ORAL | Status: DC
  Administered 2018-01-19 – 2018-01-20 (×2): 325 mg via ORAL
  Filled 2018-01-19 (×2): qty 1

## 2018-01-19 NOTE — Progress Notes (Signed)
Subjective: Postpartum Day 1: Cesarean Delivery Patient reports incisional pain, tolerating PO and + flatus. Patient will attempt to void this morning. Honeycomb was saturated, ordered dressing change with pressure dressing. Mild asymptomatic anemia.  Objective: Vitals:   01/18/18 2015 01/18/18 2315 01/19/18 0521 01/19/18 0949  BP:  116/66 (!) 98/51 118/77  Pulse:  68 75 79  Resp:  20 20 16   Temp:  98.4 F (36.9 C) 97.7 F (36.5 C) (!) 97.5 F (36.4 C)  TempSrc:  Oral Oral Oral  SpO2: 100% 97% 100% 100%  Weight:      Height:       Physical Exam:  General: alert and cooperative Lochia: appropriate Uterine Fundus: firm Incision: healing well, no dehiscence, no significant erythema DVT Evaluation: No evidence of DVT seen on physical exam. Negative Homan's sign. No cords or calf tenderness. No significant calf/ankle edema.  Recent Labs    01/17/18 1210 01/19/18 0559  HGB 9.0* 7.9*  HCT 30.0* 25.4*    Assessment/Plan: Status post Cesarean section. Doing well postoperatively.  Continue current care. Start PO Iron QD for mild asymptomatic anemia.   Janeece RiggersEllis K Bradlee Bridgers 01/19/2018, 9:56 AM

## 2018-01-19 NOTE — Progress Notes (Signed)
CSW contacted MOB's adoption attorney, Brinton Wright via telephone. Mr .Wright informed CSW that he will arrive at the hospital tomorrow to proceed with completing adoption documents. Mr. Wright with contact CSW in the morning with an ETA   CSW updated MOB.   Judy Quinn, MSW, LCSW Clinical Social Work [phone] 

## 2018-01-20 MED ORDER — ACETAMINOPHEN 325 MG PO TABS: 650.0000 mg | ORAL_TABLET | ORAL | 1 refills | Status: AC | PRN

## 2018-01-20 MED ORDER — FERROUS SULFATE 325 (65 FE) MG PO TABS: 325.0000 mg | ORAL_TABLET | Freq: Every day | ORAL | 0 refills | Status: DC

## 2018-01-20 MED ORDER — IBUPROFEN 600 MG PO TABS: 600.0000 mg | ORAL_TABLET | Freq: Four times a day (QID) | ORAL | Status: DC | PRN

## 2018-01-20 MED ORDER — OXYCODONE HCL 5 MG PO TABS: 5.0000 mg | ORAL_TABLET | ORAL | 0 refills | Status: DC | PRN

## 2018-01-20 NOTE — Progress Notes (Signed)
Discharge instructions and prescriptions given to pt. Discussed post-op care, signs and symptoms to report to the MD, upcoming appointments, and medication regimen. Pt verbalizes understanding and has no questions or concerns at this time. Pt discharged from hospital in stable condition.

## 2018-01-20 NOTE — Discharge Summary (Signed)
OB Discharge Summary   Patient Name: Judy Quinn DOB: 12-25-1990 MRN: [id number]  Date of admission: 01/18/2018  Delivering MD: Osborn Coho   Date of discharge: 01/20/2018  Admitting diagnosis: History of Classical Cesarean Section Intrauterine pregnancy: [redacted]w[redacted]d     Secondary diagnosis:  Active Problems:   Penicillin allergy   Obesity   Pregnancy with adoption planned   History of classical cesarean section   Encounter for maternal care for low transverse scar from repeat cesarean delivery   Status post repeat low transverse cesarean section    Discharge diagnosis:   Penicillin allergy   Obesity   Pregnancy with adoption planned   History of classical cesarean section   Encounter for maternal care for low transverse scar from repeat cesarean delivery   Status post repeat low transverse cesarean section     Anemia.                                                                                                Post partum procedures:None  Augmentation: None.   Complications: None  Hospital course:  Sceduled C/S   28 y.o. yo G3P1203 at [redacted]w[redacted]d was admitted to the hospital 01/18/2018 for scheduled cesarean section with the following indication:Prior Uterine Surgery.  Membrane Rupture Time/Date: 1:28 PM ,01/18/2018   Patient delivered a Viable infant.01/18/2018  Details of operation can be found in separate operative note.  Pateint had an uncomplicated postpartum course.  She is ambulating, tolerating a regular diet, passing flatus, and urinating well. Patient is discharged home in stable condition on  01/20/18         Physical exam  Vitals:   01/19/18 2303 01/20/18 0604 01/20/18 0745 01/20/18 1223  BP:  (!) 146/100 114/67 (!) 142/72  Pulse: 81 82 74 78  Resp: 19 18 17 18   Temp: 98.6 F (37 C) 97.9 F (36.6 C) 97.6 F (36.4 C) 97.7 F (36.5 C)  TempSrc: Oral Oral Oral Oral  SpO2: 100% 99% 100% 100%  Weight:      Height:       General: alert, cooperative and no  distress Lochia: appropriate Uterine Fundus: firm Incision: Dressing is clean, dry, and intact DVT Evaluation: No evidence of DVT seen on physical exam. Labs: Lab Results  Component Value Date   WBC 8.1 01/19/2018   HGB 7.9 (L) 01/19/2018   HCT 25.4 (L) 01/19/2018   MCV 78.6 (L) 01/19/2018   PLT 178 01/19/2018   No flowsheet data found. CBC Latest Ref Rng & Units 01/19/2018 01/17/2018  WBC 4.0 - 10.5 K/uL 8.1 5.2  Hemoglobin 12.0 - 15.0 g/dL 7.9(L) 9.0(L)  Hematocrit 36.0 - 46.0 % 25.4(L) 30.0(L)  Platelets 150 - 400 K/uL 178 169    Discharge instruction: per After Visit Summary and "Baby and Me Booklet".  After visit meds:  Allergies as of 01/20/2018   No Known Allergies     Medication List    STOP taking these medications   ondansetron 4 MG disintegrating tablet Commonly known as:  ZOFRAN ODT   PRENATAL VITAMIN PO     TAKE these medications  acetaminophen 325 MG tablet Commonly known as:  TYLENOL Take 2 tablets (650 mg total) by mouth every 4 (four) hours as needed for up to 14 days for mild pain.   ferrous sulfate 325 (65 FE) MG tablet Take 1 tablet (325 mg total) by mouth daily with breakfast. Start taking on:  January 21, 2018   oxyCODONE 5 MG immediate release tablet Commonly known as:  Oxy IR/ROXICODONE Take 1-2 tablets (5-10 mg total) by mouth every 4 (four) hours as needed for moderate pain or breakthrough pain.     Ibuprofen 600 mg PO Q 6hrs prn pain  Diet: routine diet  Activity: Advance as tolerated. Pelvic rest for 6 weeks.   Outpatient follow up:6 weeks Follow up Appt:No future appointments. Follow up Visit:No follow-ups on file.  Postpartum contraception: Undecided  Newborn Data: Live born female  Birth Weight: 5 lb 1.5 oz (2310 g) APGAR: 7, 8  Newborn Delivery   Birth date/time:  01/18/2018 13:29:00 Delivery type:  C-Section, Low Transverse Trial of labor:  No C-section categorization:  Repeat     Baby Feeding:  Bottle Disposition:Baby to leave hospital with adoptive parents.    01/20/2018 Judy Quinn,Judy [name], MD

## 2018-01-20 NOTE — Progress Notes (Signed)
CSW received all required adoption documents from MOB's Attorney (Brinton Wright) and Adoption Social Worker (Erin Clark).  All documents have been labeled and placed in infant's chart. Adopting parents identifications have also been copied,labled, and placed in infant's chart.   CSW provided medical team with an update.  There are no barriers to infant discharging to adopting parents.   Willa Brocks Boyd-Gilyard, MSW, LCSW Clinical Social Work [phone] 

## 2018-01-21 LAB — TYPE AND SCREEN
ABO/RH(D): O POS
Antibody Screen: POSITIVE
DONOR AG TYPE: NEGATIVE
Donor AG Type: NEGATIVE
PT AG Type: NEGATIVE
Unit division: 0
Unit division: 0

## 2018-01-21 LAB — BPAM RBC
Blood Product Expiration Date: 202001222359
Blood Product Expiration Date: 202001222359
Unit Type and Rh: 9500
Unit Type and Rh: 9500

## 2018-02-27 DIAGNOSIS — Z308 Encounter for other contraceptive management: Secondary | ICD-10-CM | POA: Diagnosis not present

## 2018-04-30 ENCOUNTER — Other Ambulatory Visit: Payer: Self-pay

## 2018-04-30 ENCOUNTER — Encounter (HOSPITAL_COMMUNITY): Payer: Self-pay

## 2018-04-30 ENCOUNTER — Ambulatory Visit (HOSPITAL_COMMUNITY)
Admission: EM | Admit: 2018-04-30 | Discharge: 2018-04-30 | Disposition: A | Discharge status: Home / Self Care | Payer: Medicaid Other | Attending: Emergency Medicine | Admitting: Emergency Medicine

## 2018-04-30 DIAGNOSIS — N730 Acute parametritis and pelvic cellulitis: Secondary | ICD-10-CM | POA: Diagnosis not present

## 2018-04-30 DIAGNOSIS — Z3202 Encounter for pregnancy test, result negative: Secondary | ICD-10-CM

## 2018-04-30 DIAGNOSIS — R509 Fever, unspecified: Secondary | ICD-10-CM

## 2018-04-30 LAB — POCT URINALYSIS DIP (DEVICE)
Bilirubin Urine: NEGATIVE
Glucose, UA: NEGATIVE mg/dL
Hgb urine dipstick: NEGATIVE
Ketones, ur: NEGATIVE mg/dL
Nitrite: NEGATIVE
Protein, ur: NEGATIVE mg/dL
Specific Gravity, Urine: 1.025 (ref 1.005–1.030)
Urobilinogen, UA: 1 mg/dL (ref 0.0–1.0)
pH: 7 (ref 5.0–8.0)

## 2018-04-30 LAB — CBC WITH DIFFERENTIAL/PLATELET
Abs Immature Granulocytes: 0.05 10*3/uL (ref 0.00–0.07)
Basophils Absolute: 0 10*3/uL (ref 0.0–0.1)
Basophils Relative: 0 %
Eosinophils Absolute: 0 10*3/uL (ref 0.0–0.5)
Eosinophils Relative: 0 %
HCT: 33.4 % — ABNORMAL LOW (ref 36.0–46.0)
Hemoglobin: 9.5 g/dL — ABNORMAL LOW (ref 12.0–15.0)
Immature Granulocytes: 0 %
Lymphocytes Relative: 12 %
Lymphs Abs: 1.5 10*3/uL (ref 0.7–4.0)
MCH: 22.1 pg — ABNORMAL LOW (ref 26.0–34.0)
MCHC: 28.4 g/dL — ABNORMAL LOW (ref 30.0–36.0)
MCV: 77.9 fL — ABNORMAL LOW (ref 80.0–100.0)
Monocytes Absolute: 0.4 10*3/uL (ref 0.1–1.0)
Monocytes Relative: 3 %
Neutro Abs: 9.9 10*3/uL — ABNORMAL HIGH (ref 1.7–7.7)
Neutrophils Relative %: 85 %
Platelets: 226 10*3/uL (ref 150–400)
RBC: 4.29 MIL/uL (ref 3.87–5.11)
RDW: 17.9 % — ABNORMAL HIGH (ref 11.5–15.5)
WBC: 11.8 10*3/uL — ABNORMAL HIGH (ref 4.0–10.5)
nRBC: 0 % (ref 0.0–0.2)

## 2018-04-30 LAB — POCT PREGNANCY, URINE: Preg Test, Ur: NEGATIVE

## 2018-04-30 MED ORDER — IBUPROFEN 800 MG PO TABS
ORAL_TABLET | ORAL | Status: AC
  Filled 2018-04-30: qty 1

## 2018-04-30 MED ORDER — IBUPROFEN 800 MG PO TABS
800.0000 mg | ORAL_TABLET | Freq: Once | ORAL | Status: AC
  Administered 2018-04-30: 800 mg via ORAL

## 2018-04-30 MED ORDER — METRONIDAZOLE 500 MG PO TABS: 500.0000 mg | ORAL_TABLET | Freq: Two times a day (BID) | ORAL | 0 refills | Status: AC

## 2018-04-30 MED ORDER — CEFTRIAXONE SODIUM 250 MG IJ SOLR
INTRAMUSCULAR | Status: AC
  Filled 2018-04-30: qty 250

## 2018-04-30 MED ORDER — LIDOCAINE HCL (PF) 1 % IJ SOLN
INTRAMUSCULAR | Status: AC
  Filled 2018-04-30: qty 2

## 2018-04-30 MED ORDER — DOXYCYCLINE HYCLATE 100 MG PO CAPS: 100.0000 mg | ORAL_CAPSULE | Freq: Two times a day (BID) | ORAL | 0 refills | Status: AC

## 2018-04-30 MED ORDER — CEFTRIAXONE SODIUM 250 MG IJ SOLR
250.0000 mg | Freq: Once | INTRAMUSCULAR | Status: AC
  Administered 2018-04-30: 250 mg via INTRAMUSCULAR

## 2018-04-30 MED ORDER — IBUPROFEN 600 MG PO TABS: 600.0000 mg | ORAL_TABLET | Freq: Four times a day (QID) | ORAL | 0 refills | Status: DC | PRN

## 2018-04-30 NOTE — Discharge Instructions (Addendum)
I suspect that you have PID.  I have treated you today with Rocephin, make sure you finish the doxycycline and Flagyl, even if you feel better.  You need to follow-up with your OB/GYN within a week.  600 mg of ibuprofen combined with 1 g of Tylenol together 3 or 4 times a day as needed for pain.  If you are not getting better in 24 hours, if you have persistent fevers above 100.4, if you start throwing up and cannot keep anything down, or if you get worse, you need to go immediately to the emergency department.

## 2018-04-30 NOTE — ED Triage Notes (Signed)
Began having abdominal pain on Sunday, hurts worse with movement, report appetite is poor, denies N/V

## 2018-04-30 NOTE — ED Provider Notes (Signed)
HPI  SUBJECTIVE:  Judy Quinn is a 28 y.o. female who presents with intermittent low midline abdominal cramping getting worse for the past 3 days.  She states it is located in the pelvic region near her C-section scar.  She reports nausea, anorexia.  Occasionally goes to the umbilicus.  Otherwise it does not radiate, or migrate.  No vomiting, fever, diarrhea.  No abdominal distention, back pain.  No dysuria, urinary urgency, frequency, cloudy , odorous urine, hematuria.  Has not noticed any erythema or dehiscence at the C-section site.  No vaginal odor, bleeding, rash or discharge.  She is in a long-term monogamous relationship with a female who is asymptomatic, STDs not a concern today.  She has tried Tylenol and heating pad.  The heating pad seems to help.  Last dose of Tylenol was more than 6 hours ago.  She states symptoms are worse with walking, torso rotation, lying down, urination.  It is not associated with eating, drinking, defecation.  She states that the car ride over here was painful.  Last bowel movement was 4 days ago, states that she normally stools every 2 to 3 days.  However this is not unusual for her.  She has a past medical history of 3 C-sections, last one in January 2020, gonorrhea.  No history of chlamydia, HSV, HIV, syphilis, trichomonas, BV, yeast, diabetes, hypertension, atrial fibrillation, mesenteric ischemia, hypercoagulability, PID, ectopic pregnancy, UTI, pyelonephritis, nephrolithiasis.  No history of ovarian cyst, PCOS.  LMP: Last week.  PMD: [facility].   Past Medical History:  Diagnosis Date  . Medical history non-contributory     Past Surgical History:  Procedure Laterality Date  . CESAREAN SECTION    . CESAREAN SECTION N/A 01/18/2018   Procedure: REPEAT CESAREAN SECTION;  Surgeon: Osborn Coho, MD;  Location: [facility] BIRTHING SU[facility];  Service: Obstetrics;  Laterality: N/A;    Family History  Problem Relation Age of Onset  . Hypertension Mother      Social History   Tobacco Use  . Smoking status: Former Games developer  . Smokeless tobacco: Never Used  Substance Use Topics  . Alcohol use: Not Currently  . Drug use: Never    No current facility-administered medications for this encounter.   Current Outpatient Medications:  .  doxycycline (VIBRAMYCIN) 100 MG capsule, Take 1 capsule (100 mg total) by mouth 2 (two) times daily for 14 days., Disp: 28 capsule, Rfl: 0 .  ibuprofen (ADVIL,MOTRIN) 600 MG tablet, Take 1 tablet (600 mg total) by mouth every 6 (six) hours as needed., Disp: 30 tablet, Rfl: 0 .  metroNIDAZOLE (FLAGYL) 500 MG tablet, Take 1 tablet (500 mg total) by mouth 2 (two) times daily for 14 days., Disp: 28 tablet, Rfl: 0  No Known Allergies   ROS  As noted in HPI.   Physical Exam  BP 116/75   Pulse (!) 131   Temp (!) 101.1 F (38.4 C) (Oral)   Resp 20   LMP 04/24/2018   SpO2 99%   Breastfeeding No   Constitutional: Well developed, well nourished, no acute distress Eyes:  EOMI, conjunctiva normal bilaterally HENT: Normocephalic, atraumatic,mucus membranes moist Respiratory: Normal inspiratory effort Cardiovascular: Regular tachycardia, no murmurs rubs or gallops GI: Positive low transverse C-section scar.  No surrounding erythema.  Positive suprapubic and bilateral lower flank tenderness.  Positive mild periumbilical and right upper quadrant tenderness, negative Murphy, negative McBurney, no guarding, rebound.  Hypoactive bowel sounds.  Positive tap table test. Back: Questionable CVA tenderness-states it aggravates  the pain up front but denies actual kidney tenderness. GU: Normal external genitalia.  Positive frothy yellowish copious vaginal discharge.  Normal os.  No bleeding.  Positive uterine tenderness.  Positive CMT.  Positive bilateral adnexal tenderness.  No adnexal masses.  Chaperone present during exam. skin: No rash, skin intact Musculoskeletal: no deformities Neurologic: Alert & oriented x 3, no focal  neuro deficits Psychiatric: Speech and behavior appropriate   ED Course   Medications  cefTRIAXone (ROCEPHIN) injection 250 mg (250 mg Intramuscular Given 04/30/18 1803)  ibuprofen (ADVIL,MOTRIN) tablet 800 mg (800 mg Oral Given 04/30/18 1804)    Orders Placed This Encounter  Procedures  . Pelvic exam    Standing Status:   Standing    Number of Occurrences:   1  . Urine culture    Standing Status:   Standing    Number of Occurrences:   1    Order Specific Question:   Patient immune status    Answer:   Normal  . RPR    Standing Status:   Standing    Number of Occurrences:   1  . HIV antibody    Standing Status:   Standing    Number of Occurrences:   1  . CBC with Differential    Standing Status:   Standing    Number of Occurrences:   1  . Recheck vitals    Standing Status:   Standing    Number of Occurrences:   1  . POC urine pregnancy    Standing Status:   Standing    Number of Occurrences:   1  . POCT urinalysis dip (device)    Standing Status:   Standing    Number of Occurrences:   1  . Pregnancy, urine POC    Standing Status:   Standing    Number of Occurrences:   1    Results for orders placed or performed during the hospital encounter of 04/30/18 (from the past 24 hour(s))  POCT urinalysis dip (device)     Status: Abnormal   Collection Time: 04/30/18  5:17 PM  Result Value Ref Range   Glucose, UA NEGATIVE NEGATIVE mg/dL   Bilirubin Urine NEGATIVE NEGATIVE   Ketones, ur NEGATIVE NEGATIVE mg/dL   Specific Gravity, Urine 1.025 1.005 - 1.030   Hgb urine dipstick NEGATIVE NEGATIVE   pH 7.0 5.0 - 8.0   Protein, ur NEGATIVE NEGATIVE mg/dL   Urobilinogen, UA 1.0 0.0 - 1.0 mg/dL   Nitrite NEGATIVE NEGATIVE   Leukocytes,Ua SMALL (A) NEGATIVE  Pregnancy, urine POC     Status: None   Collection Time: 04/30/18  5:25 PM  Result Value Ref Range   Preg Test, Ur NEGATIVE NEGATIVE  CBC with Differential     Status: Abnormal   Collection Time: 04/30/18  5:50 PM   Result Value Ref Range   WBC 11.8 (H) 4.0 - 10.5 K/uL   RBC 4.29 3.87 - 5.11 MIL/uL   Hemoglobin 9.5 (L) 12.0 - 15.0 g/dL   HCT 16.1 (L) 09.6 - 04.5 %   MCV 77.9 (L) 80.0 - 100.0 fL   MCH 22.1 (L) 26.0 - 34.0 pg   MCHC 28.4 (L) 30.0 - 36.0 g/dL   RDW 40.9 (H) 81.1 - 91.4 %   Platelets 226 150 - 400 K/uL   nRBC 0.0 0.0 - 0.2 %   Neutrophils Relative % 85 %   Neutro Abs 9.9 (H) 1.7 - 7.7 K/uL   Lymphocytes Relative 12 %  Lymphs Abs 1.5 0.7 - 4.0 K/uL   Monocytes Relative 3 %   Monocytes Absolute 0.4 0.1 - 1.0 K/uL   Eosinophils Relative 0 %   Eosinophils Absolute 0.0 0.0 - 0.5 K/uL   Basophils Relative 0 %   Basophils Absolute 0.0 0.0 - 0.1 K/uL   Immature Granulocytes 0 %   Abs Immature Granulocytes 0.05 0.00 - 0.07 K/uL   No results found.  ED Clinical Impression  PID (acute pelvic inflammatory disease)   ED Assessment/Plan  Urine dip positive for small leukocytes.  Urine pregnancy negative.  Will do a pelvic exam, check gonorrhea, chlamydia, trichomonas, BV as this can cause PID.  Also HIV, RPR per patient request.  Doubt appendicitis, diverticulitis pancreatitis, gallbladder disease, perforation at this point in time.  Also in the ddx is UTI, pyelonephritis, nephrolithiasis.  Repeat temp one 101.1.  ibuprofen 800 mg po.   Presentation most concerning for PID. Giving Rocephin 250 mg IM x1.  Checking CBC.  If this is normal, plan to send home with ibuprofen/Tylenol, Doxy 100 mg,  Flagyl 500 mg p.o. twice daily for 14 days.  She is not have any vomiting or diarrhea.  She states that she is able to get back to the ER if necessary and agrees to go if she has persistent fevers or if pain worsens/does not get better in the next 24 hours.  Feel that patient is reliable.  She states that she has reliable transportation.   Mild leukocytosis with left shift noted.  Feel that patient is stable and reliable enough to attempt outpatient treatment for now.  Follow-up with her OB/GYN  within a week.  Discussed labs, MDM, treatment plan, and plan for follow-up with patient. Discussed strict sn/sx that should prompt return to the ED. patient agrees with plan.   Meds ordered this encounter  Medications  . cefTRIAXone (ROCEPHIN) injection 250 mg    Order Specific Question:   Antibiotic Indication:    Answer:   Other Indication (list below)    Order Specific Question:   Other Indication:    Answer:   PID  . ibuprofen (ADVIL,MOTRIN) tablet 800 mg  . metroNIDAZOLE (FLAGYL) 500 MG tablet    Sig: Take 1 tablet (500 mg total) by mouth 2 (two) times daily for 14 days.    Dispense:  28 tablet    Refill:  0  . doxycycline (VIBRAMYCIN) 100 MG capsule    Sig: Take 1 capsule (100 mg total) by mouth 2 (two) times daily for 14 days.    Dispense:  28 capsule    Refill:  0  . ibuprofen (ADVIL,MOTRIN) 600 MG tablet    Sig: Take 1 tablet (600 mg total) by mouth every 6 (six) hours as needed.    Dispense:  30 tablet    Refill:  0    *This clinic note was created using Scientist, clinical (histocompatibility and immunogenetics)Dragon dictation software. Therefore, there may be occasional mistakes despite careful proofreading.   ?   Domenick GongMortenson, Ziva Nunziata, MD 04/30/18 1844

## 2018-05-01 ENCOUNTER — Telehealth (HOSPITAL_COMMUNITY): Payer: Self-pay | Admitting: Emergency Medicine

## 2018-05-01 LAB — URINE CULTURE
Culture: NO GROWTH
Special Requests: NORMAL

## 2018-05-01 LAB — HIV ANTIBODY (ROUTINE TESTING W REFLEX): HIV Screen 4th Generation wRfx: NONREACTIVE

## 2018-05-01 LAB — CERVICOVAGINAL ANCILLARY ONLY
Bacterial vaginitis: POSITIVE — AB
Chlamydia: NEGATIVE
Neisseria Gonorrhea: POSITIVE — AB
Trichomonas: NEGATIVE

## 2018-05-01 LAB — RPR: RPR Ser Ql: NONREACTIVE

## 2018-05-01 NOTE — Telephone Encounter (Signed)
Test for gonorrhea was positive. This was treated at the urgent care visit with IM rocephin 250mg  and po zithromax 1g. Pt needs education to refrain from sexual intercourse for 7 days after treatment to give the medicine time to work. Sexual partners need to be notified and tested/treated. Condoms may reduce risk of reinfection. Recheck or followup with PCP for further evaluation if symptoms are not improving. [facility] notified.   Bacterial Vaginosis test is positive.  Prescription for metronidazole was given at the urgent care visit. Pt contacted regarding results. Answered all questions. Verbalized understanding.  Attempted to reach patient. No answer at this time. Voicemail left.

## 2018-05-01 NOTE — Telephone Encounter (Signed)
Patient contacted and made aware of all results, all questions answered.   

## 2018-07-02 ENCOUNTER — Ambulatory Visit: Payer: Medicaid Other | Admitting: Podiatry

## 2018-07-02 ENCOUNTER — Other Ambulatory Visit: Payer: Self-pay

## 2018-07-02 DIAGNOSIS — M79674 Pain in right toe(s): Secondary | ICD-10-CM

## 2018-07-02 DIAGNOSIS — S91211A Laceration without foreign body of right great toe with damage to nail, initial encounter: Secondary | ICD-10-CM | POA: Diagnosis not present

## 2018-07-02 DIAGNOSIS — L603 Nail dystrophy: Secondary | ICD-10-CM

## 2018-07-02 NOTE — Patient Instructions (Signed)

## 2018-07-08 NOTE — Progress Notes (Signed)
Subjective:   Patient ID: Judy Quinn, female   DOB: 28 y.o.   MRN: [id number]   HPI 28 year old female presents the office today for concerns of her right big toenail becoming loose.  She states that the tabletop on her nail about 2 weeks ago and most of the nail has come off however there is only a small percent patch to his growing straight.  She states that it causes irritation inside of her shoes.  Denies any redness or drainage or any swelling.  Review of Systems  All other systems reviewed and are negative.  Past Medical History:  Diagnosis Date  . Medical history non-contributory     Past Surgical History:  Procedure Laterality Date  . CESAREAN SECTION    . CESAREAN SECTION N/A 01/18/2018   Procedure: REPEAT CESAREAN SECTION;  Surgeon: Everett Graff, MD;  Location: [place];  Service: Obstetrics;  Laterality: N/A;     Current Outpatient Medications:  .  doxycycline (VIBRAMYCIN) 100 MG capsule, doxycycline hyclate 100 mg capsule  TK 1 C PO BID FOR 14 DAYS, Disp: , Rfl:  .  ibuprofen (ADVIL,MOTRIN) 600 MG tablet, Take 1 tablet (600 mg total) by mouth every 6 (six) hours as needed., Disp: 30 tablet, Rfl: 0 .  metroNIDAZOLE (FLAGYL) 500 MG tablet, metronidazole 500 mg tablet  TK 1 T PO BID FOR 14 DAYS, Disp: , Rfl:   No Known Allergies       Objective:  Physical Exam  General: AAO x3, NAD  Dermatological: Only small portion of the nail remains at the proximal aspect and is dystrophic with brown discoloration. It is growing vertically.  There is no edema, erythema, drainage or pus or any signs of infection.  No open lesions.  Vascular: Dorsalis Pedis artery and Posterior Tibial artery pedal pulses are 2/4 bilateral with immedate capillary fill time. Pedal hair growth present. No varicosities and no lower extremity edema present bilateral. There is no pain with calf compression, swelling, warmth, erythema.   Neruologic: Grossly intact via light touch  bilateral.   Musculoskeletal: Tenderness only to the nail itself is no point tenderness.  Muscular strength 5/5 in all groups tested bilateral.  Gait: Unassisted, Nonantalgic.       Assessment:   Onychodystrophy, onycholysis    Plan:  -Treatment options discussed including all alternatives, risks, and complications -Etiology of symptoms were discussed -At this time, recommended total nail removal without chemical matricectomy to the right hallux due to pain/damage. Risks and complications were discussed with the patient for which they understand and  verbally consent to the procedure. Under sterile conditions a total of 3 mL of a mixture of 2% lidocaine plain and 0.5% Marcaine plain was infiltrated in a hallux block fashion. Once anesthetized, the skin was prepped in sterile fashion. A tourniquet was then applied. Next the right hallux nail was sharply excised making sure to remove the entire offending nail border. Once the nail was  Removed, the area was debrided and the underlying skin was intact. The area was irrigated and hemostasis was obtained.  A dry sterile dressing was applied. After application of the dressing the tourniquet was removed and there is found to be an immediate capillary refill time to the digit. The patient tolerated the procedure well any complications. Post procedure instructions were discussed the patient for which he verbally understood. Follow-up in one week for nail check or sooner if any problems are to arise. Discussed signs/symptoms of worsening infection and directed to call  the office immediately should any occur or go directly to the emergency room. In the meantime, encouraged to call the office with any questions, concerns, changes symptoms.  Vivi BarrackMatthew R Haely Leyland DPM

## 2018-11-06 DIAGNOSIS — Z1388 Encounter for screening for disorder due to exposure to contaminants: Secondary | ICD-10-CM | POA: Diagnosis not present

## 2018-11-06 DIAGNOSIS — Z3009 Encounter for other general counseling and advice on contraception: Secondary | ICD-10-CM | POA: Diagnosis not present

## 2018-11-06 DIAGNOSIS — Z113 Encounter for screening for infections with a predominantly sexual mode of transmission: Secondary | ICD-10-CM | POA: Diagnosis not present

## 2018-11-06 DIAGNOSIS — Z0389 Encounter for observation for other suspected diseases and conditions ruled out: Secondary | ICD-10-CM | POA: Diagnosis not present

## 2018-12-11 IMAGING — US US MFM OB DETAIL+14 WK
2 series · 16 of 28 positions shown · non-contrast
Comparison: none

[Series 1: us mfm ob detail+14 wk · 15 of 73 slices shown (1 of 2)]
[im 1/73]
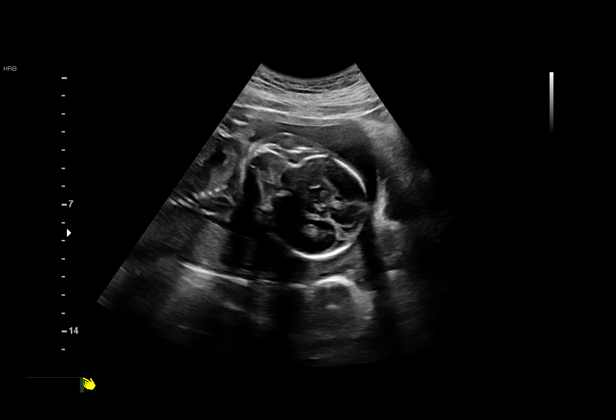
[im 6/73]
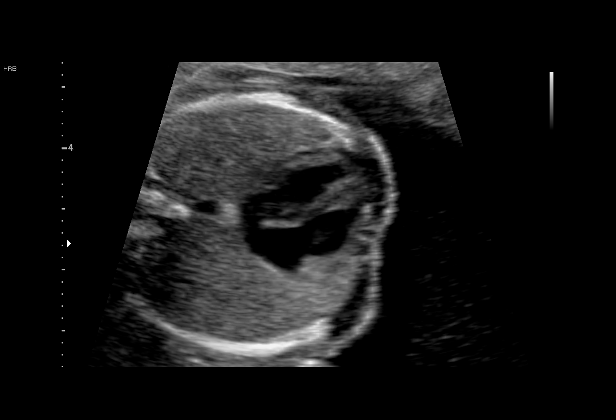
[im 12/73]
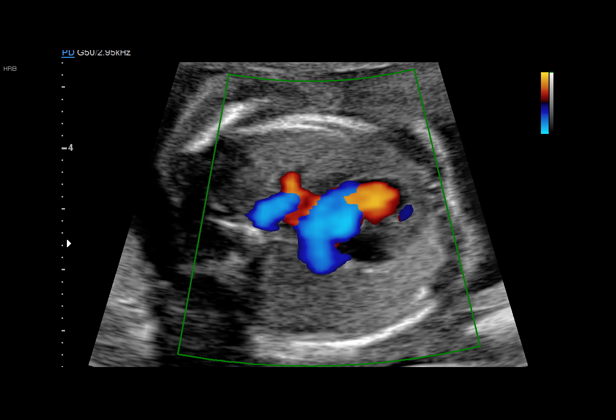
[im 17/73]
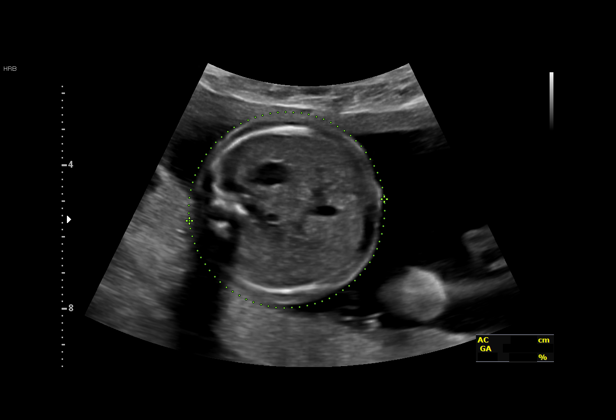
[im 20/73]
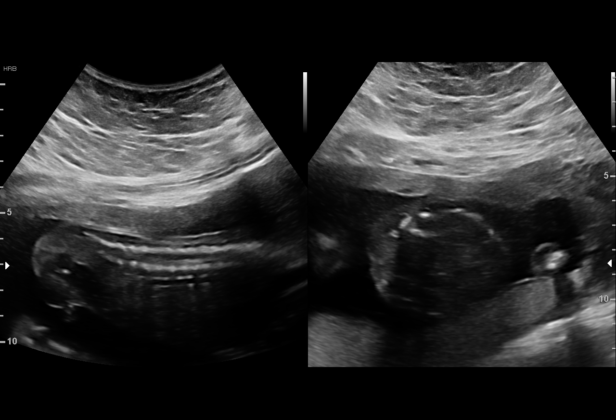
[im 25/73]
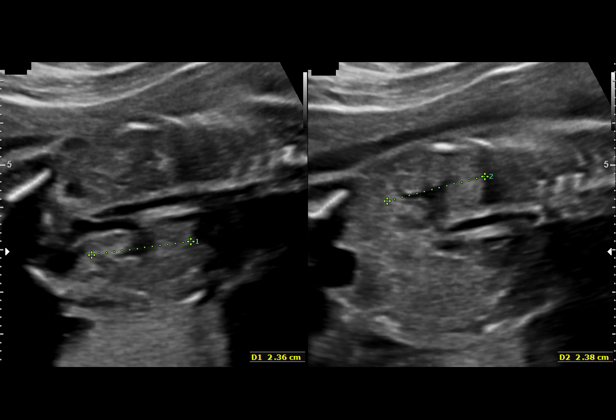
[im 31/73]
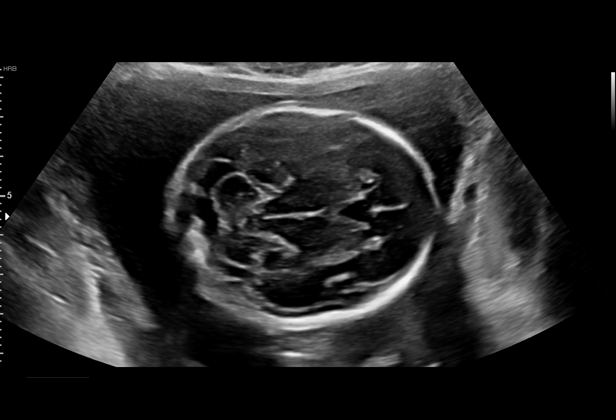
[im 37/73]
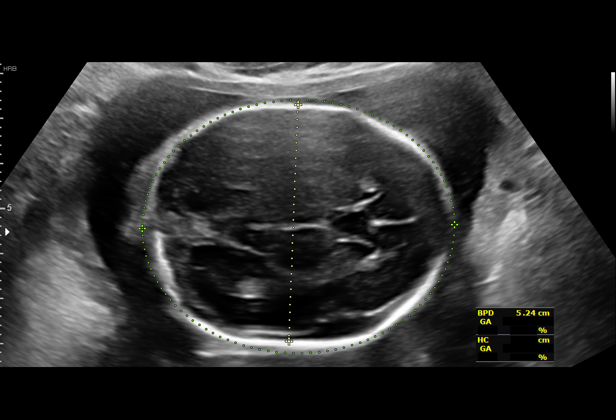
[im 39/73]
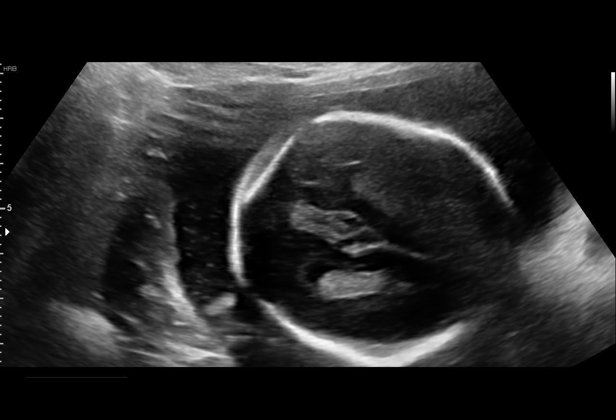
[im 45/73]
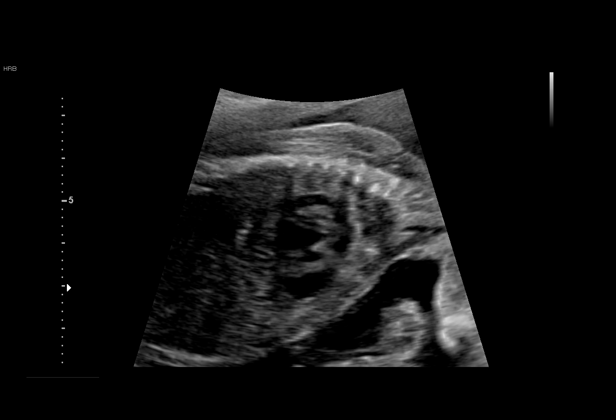
[im 50/73]
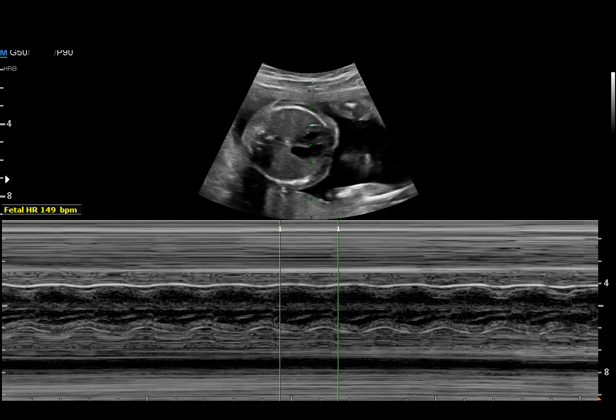
[im 56/73]
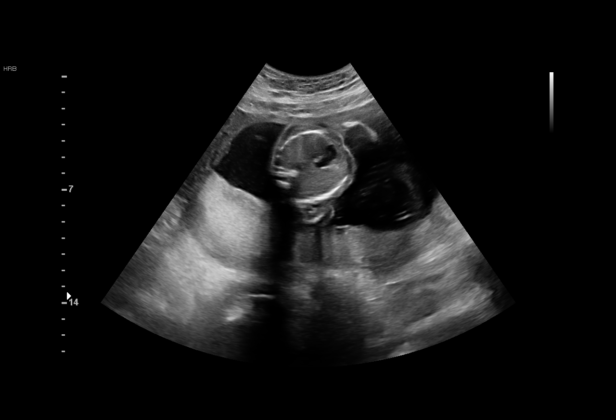
[im 59/73]
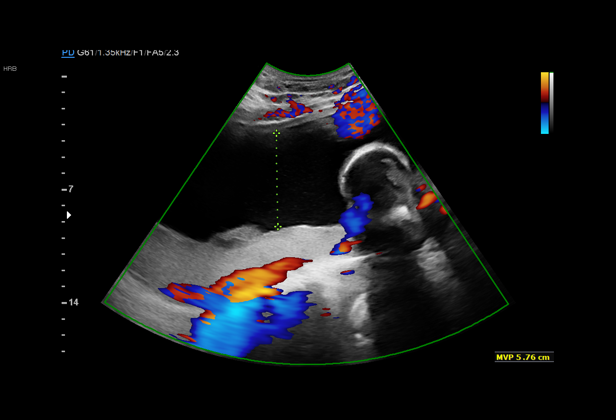
[im 64/73]
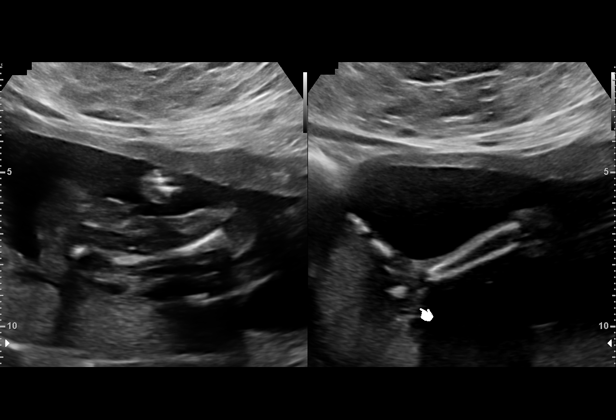
[im 70/73]
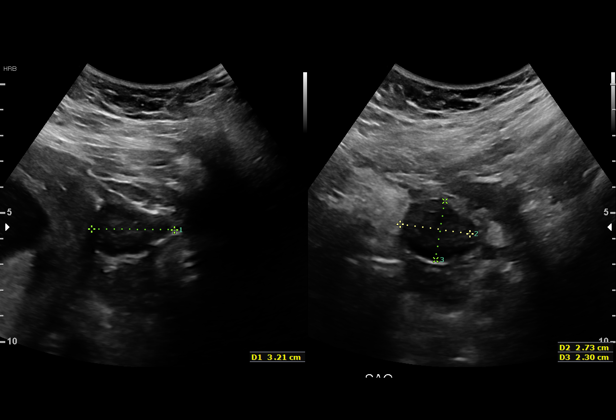

[Series 3: us mfm ob detail+14 wk · 1 of 1 slices shown (2 of 2)]
[im 1/1]
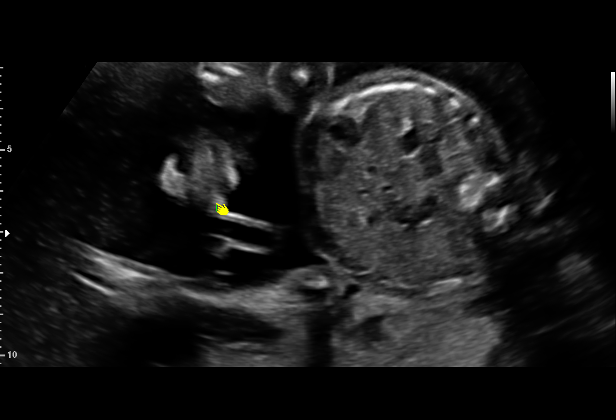

[16 of 28 positions shown; findings below may reference images not displayed]

Obstetrics &
Gynecology
[address]
[place]

Indications

22 weeks gestation of pregnancy
Encounter for antenatal screening for
malformations (neg AFP)
History of cesarean delivery, currently
pregnant X2
Poor obstetric history: Previous preterm
delivery, antepartum @26 & 60wks
Poor obstetric history: Previous fetal growth
restriction (FGR)
Obesity complicating pregnancy, second
trimester
Vital Signs

BMI:
Fetal Evaluation

Num Of Fetuses:         1
Fetal Heart Rate(bpm):  149
Cardiac Activity:       Observed
Presentation:           Cephalic
Placenta:               Posterior
P. Cord Insertion:      Visualized, central
Amniotic Fluid
AFI FV:      Within normal limits

Largest Pocket(cm)
5.76
Biometry

BPD:      52.5  mm     G. Age:  22w 0d         28  %    CI:        71.44   %    70 - 86
FL/HC:      19.0   %    18.4 -
HC:      197.8  mm     G. Age:  22w 0d         21  %    HC/AC:      1.15        1.06 -
AC:      171.6  mm     G. Age:  22w 1d         33  %    FL/BPD:     71.6   %    71 - 87
FL:       37.6  mm     G. Age:  22w 0d         26  %    FL/AC:      21.9   %    20 - 24
HUM:      35.8  mm     G. Age:  22w 3d         45  %
CER:      24.6  mm     G. Age:  22w 4d         53  %

LV:        5.9  mm
CM:        3.7  mm

Est. FW:     472  gm      1 lb 1 oz     39  %
OB History

Gravidity:    3         Term:   2
Living:       2
Gestational Age

LMP:           19w 0d        Date:  05/28/17                 EDD:   03/04/18
U/S Today:     22w 0d                                        EDD:   02/11/18
Best:          22w 3d     Det. By:  Previous Ultrasound      EDD:   02/08/18
(08/09/17)
Anatomy

Cranium:               Appears normal         Aortic Arch:            Appears normal
Cavum:                 Appears normal         Ductal Arch:            Appears normal
Ventricles:            Appears normal         Diaphragm:              Appears normal
Choroid Plexus:        Appears normal         Stomach:                Appears normal, left
sided
Cerebellum:            Appears normal         Abdomen:                Appears normal
Posterior Fossa:       Appears normal         Abdominal Wall:         Appears nml (cord
insert, abd wall)
Nuchal Fold:           Not applicable (>20    Cord Vessels:           Appears normal (3
wks GA)                                        vessel cord)
Face:                  Appears normal         Kidneys:                Appear normal
(orbits and profile)
Lips:                  Appears normal         Bladder:                Appears normal
Thoracic:              Appears normal         Spine:                  Appears normal
Heart:                 Appears normal         Upper Extremities:      Appears normal
(4CH, axis, and situs
RVOT:                  Appears normal         Lower Extremities:      Appears normal
LVOT:                  Appears normal

Other:  Parents do not wish to know sex of fetus. Heels visualized.
Technically difficult due to maternal habitus.
Cervix Uterus Adnexa
Cervix
Length:           3.63  cm.
Normal appearance by transabdominal scan.

Uterus
No abnormality visualized.

Left Ovary
Size(cm)     2.73   x   2.3    x  3.21      Vol(ml):
Within normal limits.

Right Ovary
Size(cm)     3.51   x   1.52   x  1.55      Vol(ml):
Within normal limits.

Cul De Sac
No free fluid seen.

Adnexa
No abnormality visualized.
Impression

On ultrasound, no markers of fetal aneuploidies or fetal
structural defects are seen. Fetal biometry is consistent with
her previously-established dates. Amniotic fluid is normal and
good fetal activity is seen. Placenta is posterior and there is
no evidence of previa or accreta.
Consultation Note (copied from [REDACTED]):
Patient's Name: Deepti Wicker
MRN: [id number]
Requesting Provider: Janory Bucardo, CNM

I [facility] the [facility] She is a G3 UWOWO at 22-weeks'
gestation, is here for fetal anatomy scan and consultation.
Obstetric history is significant for fetal growth restriction.
Obstetric history:
-[DATE]: Classical cesarean delivery of a male infant
weighing 1 lb 6 oz. at 26-weeks' gestation at [REDACTED].  Patient reports that she was told that her baby had
growth restriction and "abnormal blood flow." She was
admitted at 26 weeks' gestation for about 3 days. Classical
cesarean section was performed because of nonreassuring
fetal heart trace ("baby's heart rate dropped"). The newborn
stayed in the NICU for about 4 months. Now, her son has
cerebral palsy.
She did not have hypertension or diabetes in that pregnancy.
-[DATE]: Repeat cesarean delivery at ?36 weeks' gestation.
Patient reports that she was told that her baby had anemia.
Her son weighed 4-8 at birth and he is in good health now.
Gyn history: No history of cervical surgeries.
PMH: No history of diabetes or hypertension or any other
chronic medical conditions. She does not have sickle-cell
trait. Anti-E antibodies were found on screening, but recent
repeat sample showed no anti-E antibodies.
PSH: Two cesarean sections (including classical cesarean).
Medications: Prenatal vitamins, calcium, vitamin D.
Allergies: NKDA.
Social: Ex-smoker before pregnancy (2 cigarettes daily). No
tobacco or alcohol or drug use now. Her partner (father of her
current and second child) is an African American and he is in
good health. He has sickle-cell trait.
Family: No history of venous thromboembolism in the family.
Prenatal care: Her pregnancy is well-dated by early
ultrasound. On cell-free fetal DNA screening, the risks of fetal
aneuploidies are not increased. MSAFP screening showed
low risk for open-neural tube defects.
Our concerns include:
History of fetal growth restriction:
We discussed possible causes of growth restriction including
chromosomal anomalies and maternal medical conditions.
insufficiency seems to be the most-likely cause of growth
restriction. Recurrent fetal growth restriction can occur in up
to 20% of cases and we recommend serial fetal growth
assessments to identify growth restriction early in pregnancy.
I do not recommend thrombophilia work-up as the
relationship with growth restriction is very tenuous. Also, low-
dose aspirin or heparin is not recommended.
I am unable to determine the exact reason for the provider's
decision to deliver her second child at 36 weeks (?). Anti-E
antibodies can cause fetal anemia. However, the infant was
discharged with the mother and did not have postnatal blood
transfusion.
I recommend we try to obtain her previous prenatal/delivery
records.
Classical cesarean section:
Recommended time of delivery is at 37 weeks to prevent scar
rupture. Repeat cesarean deliveries increase the risk of
placenta previa and accreta. It is reassuring to note that the
placental position is normal (posterior).
Recommendations

-An appointment was made for her to return in 4 weeks for
fetal growth assessment.
-Serial fetal growth assessment every 4 weeks that may be
performed at your office. I recommend that growth
assessments are performed at a single institution to monitor
interval growth.
-Repeat screening for antibodies before her next visit (Anti-E
antibodies).

## 2019-02-06 DIAGNOSIS — Z3046 Encounter for surveillance of implantable subdermal contraceptive: Secondary | ICD-10-CM | POA: Diagnosis not present

## 2019-02-07 ENCOUNTER — Other Ambulatory Visit: Payer: Medicaid Other

## 2019-02-10 ENCOUNTER — Other Ambulatory Visit: Payer: Medicaid Other

## 2019-02-11 ENCOUNTER — Ambulatory Visit: Payer: Medicaid Other | Attending: Internal Medicine

## 2019-02-11 DIAGNOSIS — Z20822 Contact with and (suspected) exposure to covid-19: Secondary | ICD-10-CM

## 2019-02-12 LAB — NOVEL CORONAVIRUS, NAA: SARS-CoV-2, NAA: NOT DETECTED

## 2019-04-09 ENCOUNTER — Ambulatory Visit: Payer: Medicaid Other | Attending: Internal Medicine

## 2019-04-09 ENCOUNTER — Other Ambulatory Visit: Payer: Medicaid Other

## 2019-04-09 DIAGNOSIS — Z20822 Contact with and (suspected) exposure to covid-19: Secondary | ICD-10-CM

## 2019-04-10 LAB — SARS-COV-2, NAA 2 DAY TAT

## 2019-04-10 LAB — NOVEL CORONAVIRUS, NAA: SARS-CoV-2, NAA: NOT DETECTED

## 2019-04-24 ENCOUNTER — Encounter (HOSPITAL_COMMUNITY): Payer: Self-pay

## 2019-04-24 ENCOUNTER — Other Ambulatory Visit: Payer: Self-pay

## 2019-04-24 ENCOUNTER — Ambulatory Visit (HOSPITAL_COMMUNITY)
Admission: EM | Admit: 2019-04-24 | Discharge: 2019-04-24 | Disposition: A | Discharge status: Home / Self Care | Payer: Medicaid Other | Attending: Family Medicine | Admitting: Family Medicine

## 2019-04-24 DIAGNOSIS — K5900 Constipation, unspecified: Secondary | ICD-10-CM | POA: Diagnosis not present

## 2019-04-24 DIAGNOSIS — R1031 Right lower quadrant pain: Secondary | ICD-10-CM | POA: Insufficient documentation

## 2019-04-24 DIAGNOSIS — Z3202 Encounter for pregnancy test, result negative: Secondary | ICD-10-CM | POA: Insufficient documentation

## 2019-04-24 DIAGNOSIS — Z113 Encounter for screening for infections with a predominantly sexual mode of transmission: Secondary | ICD-10-CM | POA: Insufficient documentation

## 2019-04-24 LAB — POCT URINALYSIS DIP (DEVICE)
Bilirubin Urine: NEGATIVE
Glucose, UA: NEGATIVE mg/dL
Hgb urine dipstick: NEGATIVE
Ketones, ur: NEGATIVE mg/dL
Leukocytes,Ua: NEGATIVE
Nitrite: NEGATIVE
Protein, ur: NEGATIVE mg/dL
Specific Gravity, Urine: >=1.03 (ref 1.005–1.030)
Urobilinogen, UA: 0.2 mg/dL (ref 0.0–1.0)
pH: 5.5 (ref 5.0–8.0)

## 2019-04-24 LAB — RAPID HIV SCREEN (HIV 1/2 AB+AG)
HIV 1/2 Antibodies: NONREACTIVE
HIV-1 P24 Antigen - HIV24: NONREACTIVE

## 2019-04-24 LAB — RPR: RPR Ser Ql: NONREACTIVE

## 2019-04-24 MED ORDER — POLYETHYLENE GLYCOL 3350 17 G PO PACK: 17.0000 g | PACK | Freq: Every day | ORAL | 0 refills | Status: DC

## 2019-04-24 NOTE — ED Provider Notes (Signed)
[facility]    CSN: [id number] Arrival date & time: 04/24/19  0827      History   Chief Complaint Chief Complaint  Patient presents with  . Abdominal Pain    HPI Judy Quinn is a 29 y.o. female.   Patient reports that she has been experiencing lower abdominal pain for the last 2 days.  Reports the pain is aching.  Reports that she has normal menstrual cycles.  Denies dysuria, urinary frequency, urinary urgency.  Denies vaginal discharge, vaginal odor, itching or burning.  Has made no attempt to treat this at home.  Per chart review, patient has no contributory significant medical history.  Patient is requesting STD testing today,  Patient also endorses that she struggles with constipation.  Denies headache, sore throat, shortness of breath, chills, body aches, nausea, vomiting, diarrhea, fever, rash, other symptoms.  ROS per HPI  The history is provided by the patient.    Past Medical History:  Diagnosis Date  . Medical history non-contributory     Patient Active Problem List   Diagnosis Date Noted  . Encounter for maternal care for low transverse scar from repeat cesarean delivery 01/18/2018  . Status post repeat low transverse cesarean section 01/18/2018  . Obesity 01/17/2018  . Pregnancy with adoption planned 01/17/2018  . History of classical cesarean section 01/17/2018  . Abdominal pain affecting pregnancy 12/25/2017  . Diarrhea 12/25/2017  . Nausea vomiting and diarrhea 12/25/2017  . 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 Penicillin allergy 04/20/2015    Past Surgical History:  Procedure Laterality Date  . CESAREAN SECTION    . CESAREAN SECTION N/A 01/18/2018   Procedure: REPEAT CESAREAN SECTION;  Surgeon: Osborn Coho, MD;  Location: [facility] BIRTHING SUITES;  Service: Obstetrics;  Laterality: N/A;    OB History    Gravida  3   Para  3   Term  1   Preterm  2   AB      Living  3     SAB      TAB      Ectopic      Multiple   0   Live Births  1            Home Medications    Prior to Admission medications   Medication Sig Start Date End Date Taking? Authorizing Provider  doxycycline (VIBRAMYCIN) 100 MG capsule doxycycline hyclate 100 mg capsule  TK 1 C PO BID FOR 14 DAYS    [provider]  ibuprofen (ADVIL,MOTRIN) 600 MG tablet Take 1 tablet (600 mg total) by mouth every 6 (six) hours as needed. 04/30/18   Domenick Gong, MD  metroNIDAZOLE (FLAGYL) 500 MG tablet metronidazole 500 mg tablet  TK 1 T PO BID FOR 14 DAYS    [provider]  polyethylene glycol (MIRALAX / GLYCOLAX) 17 g packet Take 17 g by mouth daily. 04/24/19   Moshe Cipro, NP    Family History Family History  Problem Relation Age of Onset  . Hypertension Mother     Social History Social History   Tobacco Use  . Smoking status: Former Games developer  . Smokeless tobacco: Never Used  Substance Use Topics  . Alcohol use: Not Currently  . Drug use: Never     Allergies   Patient has no known allergies.   Review of Systems Review of Systems   Physical Exam Triage Vital Signs ED Triage Vitals [04/24/19 0841]  Enc Vitals Group  BP      Pulse      Resp      Temp      Temp src      SpO2      Weight      Height      Head Circumference      Peak Flow      Pain Score 2     Pain Loc      Pain Edu?      Excl. in [place]?    No data found.  Updated Vital Signs BP 136/89 (BP Location: Right Arm)   Pulse 85   Temp 97.9 F (36.6 C) (Oral)   Resp 14   LMP 02/24/2019 (Within Days)   SpO2 100%   Visual Acuity Right Eye Distance:   Left Eye Distance:   Bilateral Distance:    Right Eye Near:   Left Eye Near:    Bilateral Near:     Physical Exam Vitals and nursing note reviewed.  Constitutional:      General: She is not in acute distress.    Appearance: She is well-developed. She is obese. She is not ill-appearing, toxic-appearing or diaphoretic.  HENT:     Head: Normocephalic and  atraumatic.     Right Ear: Tympanic membrane normal.     Left Ear: Tympanic membrane normal.     Nose: Nose normal.     Mouth/Throat:     Mouth: Mucous membranes are moist.     Pharynx: Oropharynx is clear.  Eyes:     Conjunctiva/sclera: Conjunctivae normal.  Cardiovascular:     Rate and Rhythm: Normal rate and regular rhythm.     Heart sounds: Normal heart sounds. No murmur.  Pulmonary:     Effort: Pulmonary effort is normal. No respiratory distress.     Breath sounds: Normal breath sounds. No stridor. No wheezing, rhonchi or rales.  Chest:     Chest wall: No tenderness.  Abdominal:     General: Bowel sounds are normal.     Palpations: Abdomen is soft.     Tenderness: There is no abdominal tenderness.  Musculoskeletal:        General: Normal range of motion.     Cervical back: Normal range of motion and neck supple.  Lymphadenopathy:     Cervical: No cervical adenopathy.  Skin:    General: Skin is warm and dry.     Capillary Refill: Capillary refill takes less than 2 seconds.  Neurological:     General: No focal deficit present.     Mental Status: She is alert and oriented to person, place, and time.  Psychiatric:        Mood and Affect: Mood normal.        Behavior: Behavior normal.        Thought Content: Thought content normal.      UC Treatments / Results  Labs (all labs ordered are listed, but only abnormal results are displayed) Labs Reviewed  CERVICOVAGINAL ANCILLARY ONLY - Abnormal; Notable for the following components:      Result Value   Bacterial Vaginitis (gardnerella) Positive (*)    All other components within normal limits  URINE CULTURE  RPR  RAPID HIV SCREEN (HIV 1/2 AB+AG)  POC URINE PREG, ED  POCT URINALYSIS DIP (DEVICE)  POCT PREGNANCY, URINE    EKG   Radiology No results found.  Procedures Procedures (including critical care time)  Medications Ordered in UC Medications - No data to display  Initial Impression / Assessment and  Plan / UC Course  I have reviewed the triage vital signs and the nursing notes.  Pertinent labs & imaging results that were available during my care of the patient were reviewed by me and considered in my medical decision making (see chart for details).  Clinical Course as of Apr 24 2245  Thu Apr 24, 2019  0930 Pregnancy, urine POC [SM]    Clinical Course User Index [SM] Moshe Cipro, NP    Right lower quadrant abdominal pain: Abdomen is nontender to palpation during exam.  Presents with abdominal pain times last 2 days.  Urine pregnancy test was negative in office today.  Have vaginal swab obtained for gonorrhea, chlamydia, trichomonas, BV, yeast.  Blood drawn for RPR and HIV testing.  Instructed patient to abstain from sex until results are back and negative or until treatment is completedInstructed patient that to help with her constipation, she can increase fiber and water in her diet.  MiraLAX also prescribed to her preferred pharmacy.  Patient instructed that her STD testing results will be available via MyChart.  If she is positive for anything and needs treatment we will call her and let her know.  Instructed patient to follow-up in the ER for further evaluation and treatment of trouble swallowing, trouble breathing, or other concerning symptoms. Final Clinical Impressions(s) / UC Diagnoses   Final diagnoses:  Right lower quadrant abdominal pain  Constipation, unspecified constipation type  Screen for STD (sexually transmitted disease)  Pregnancy test negative     Discharge Instructions      Your STD tests are pending.  If your test results are positive, we will call you.  You may need additional treatment and your partner(s) may also need treatment.      You may have a urinary tract infection. We are going to culture your urine and will call you as soon as we have the results.   Your pregnancy test was negative in the office today.  Drink plenty of water, 8-10 glasses  per day.   You may take AZO over the counter for painful urination.   Go to the Emergency Department if you experience severe pain, shortness of breath, high fever, or other concerns.   I have also sent in a prescription for Miralax for you to use as well. You may use one packet daily as needed mixed in water or juice. Drink 8-10 glasses of water with this medication.      ED Prescriptions    Medication Sig Dispense Auth. Provider   polyethylene glycol (MIRALAX / GLYCOLAX) 17 g packet Take 17 g by mouth daily. 14 each Moshe Cipro, NP     PDMP not reviewed this encounter.   Moshe Cipro, NP 04/25/19 2247

## 2019-04-24 NOTE — Discharge Instructions (Addendum)
  Your STD tests are pending.  If your test results are positive, we will call you.  You may need additional treatment and your partner(s) may also need treatment.      You may have a urinary tract infection. We are going to culture your urine and will call you as soon as we have the results.   Your pregnancy test was negative in the office today.  Drink plenty of water, 8-10 glasses per day.   You may take AZO over the counter for painful urination.   Go to the Emergency Department if you experience severe pain, shortness of breath, high fever, or other concerns.   I have also sent in a prescription for Miralax for you to use as well. You may use one packet daily as needed mixed in water or juice. Drink 8-10 glasses of water with this medication.

## 2019-04-24 NOTE — ED Triage Notes (Signed)
C/o RLQ pain x 2 days.

## 2019-04-25 ENCOUNTER — Telehealth (HOSPITAL_COMMUNITY): Payer: Self-pay

## 2019-04-25 LAB — CERVICOVAGINAL ANCILLARY ONLY
Bacterial Vaginitis (gardnerella): POSITIVE — AB
Candida Glabrata: NEGATIVE
Candida Vaginitis: NEGATIVE
Chlamydia: NEGATIVE
Comment: NEGATIVE
Comment: NEGATIVE
Comment: NEGATIVE
Comment: NEGATIVE
Comment: NEGATIVE
Comment: NORMAL
Neisseria Gonorrhea: NEGATIVE
Trichomonas: NEGATIVE

## 2019-04-25 LAB — URINE CULTURE: Culture: NO GROWTH

## 2019-04-28 ENCOUNTER — Telehealth (HOSPITAL_COMMUNITY): Payer: Self-pay | Admitting: Emergency Medicine

## 2019-04-28 ENCOUNTER — Telehealth (HOSPITAL_COMMUNITY): Payer: Self-pay

## 2019-04-28 MED ORDER — POLYETHYLENE GLYCOL 3350 17 G PO PACK: 17.0000 g | PACK | Freq: Every day | ORAL | 0 refills | Status: DC

## 2019-04-28 MED ORDER — METRONIDAZOLE 500 MG PO TABS: 500.0000 mg | ORAL_TABLET | Freq: Two times a day (BID) | ORAL | 0 refills | Status: AC

## 2019-04-28 MED ORDER — METRONIDAZOLE 500 MG PO TABS: 500.0000 mg | ORAL_TABLET | Freq: Two times a day (BID) | ORAL | 0 refills | Status: DC

## 2019-05-14 ENCOUNTER — Encounter: Payer: Self-pay | Admitting: Internal Medicine

## 2019-05-14 ENCOUNTER — Telehealth (INDEPENDENT_AMBULATORY_CARE_PROVIDER_SITE_OTHER): Payer: Medicaid Other | Admitting: Internal Medicine

## 2019-05-14 DIAGNOSIS — R1084 Generalized abdominal pain: Secondary | ICD-10-CM

## 2019-05-14 DIAGNOSIS — B3731 Acute candidiasis of vulva and vagina: Secondary | ICD-10-CM

## 2019-05-14 DIAGNOSIS — B373 Candidiasis of vulva and vagina: Secondary | ICD-10-CM

## 2019-05-14 DIAGNOSIS — Z7689 Persons encountering health services in other specified circumstances: Secondary | ICD-10-CM

## 2019-05-14 MED ORDER — FLUCONAZOLE 150 MG PO TABS: ORAL_TABLET | ORAL | 0 refills | Status: DC

## 2019-05-14 NOTE — Progress Notes (Signed)
Virtual Visit via Telephone Note  I connected with Judy Quinn, on 05/14/2019 at 10:45 AM by telephone due to the COVID-19 pandemic and verified that I am speaking with the correct person using two identifiers.   Consent: I discussed the limitations, risks, security and privacy concerns of performing an evaluation and management service by telephone and the availabity of in person appointments. I also discussed with the patient that there may be a patient responsible charge related to this service. The patient expressed understanding and agreed to proceed.   Location of Patient: Home   Location of Provider: Clinic    Persons participating in Telemedicine visit: Kyriaki Moder [facility] Dr. Earlene Plater   History of Present Illness: Patient has a visit to establish care.   She went to urgent care on 4/8 for abdominal pain. She was treated for constipation and BV with Flagyl. Her screening was negative for STDs.   She reports that abdominal pain has resolved. She also was given Flagyl for BV and reports that vaginal discharge resolved for 2-3 days following treatment. On Sunday, she started noting that she was having vaginal discharge again. It seems thicker and she is having itchiness now. Afebrile and no vomiting.    Past Medical History:  Diagnosis Date  . Medical history non-contributory    No Known Allergies  No current outpatient medications on file prior to visit.   No current facility-administered medications on file prior to visit.    Observations/Objective: NAD. Speaking clearly.  Work of breathing normal.  Alert and oriented. Mood appropriate.   Assessment and Plan: 1. Encounter to establish care  2. Yeast vaginitis Vaginal discharge sounds most consistent with yeast infection given pruritis and thick discharge with history of recent treatment for BV. Will treat with Diflucan for presumed infection. If does not resolve or recurs will need to return for  further testing/exam.  - fluconazole (DIFLUCAN) 150 MG tablet; Take one tablet now. Take second tablet in 72 hours if still feeling symptoms.  Dispense: 2 tablet; Refill: 0  3. Generalized abdominal pain Resolved. Counseled on fiber in diet and adequate hydration to prevent constipation.    Follow Up Instructions: Annual exam    I discussed the assessment and treatment plan with the patient. The patient was provided an opportunity to ask questions and all were answered. The patient agreed with the plan and demonstrated an understanding of the instructions.   The patient was advised to call back or seek an in-person evaluation if the symptoms worsen or if the condition fails to improve as anticipated.     I provided 12 minutes total of non-face-to-face time during this encounter including median intraservice time, reviewing previous notes, investigations, ordering medications, medical decision making, coordinating care and patient verbalized understanding at the end of the visit.    Marcy Siren, D.O. Primary Care at [facility]  05/14/2019, 10:45 AM

## 2019-05-26 ENCOUNTER — Ambulatory Visit: Payer: Medicaid Other | Attending: Internal Medicine

## 2019-05-26 DIAGNOSIS — Z20822 Contact with and (suspected) exposure to covid-19: Secondary | ICD-10-CM | POA: Diagnosis not present

## 2019-05-27 LAB — SARS-COV-2, NAA 2 DAY TAT

## 2019-05-27 LAB — NOVEL CORONAVIRUS, NAA: SARS-CoV-2, NAA: NOT DETECTED

## 2019-06-04 DIAGNOSIS — Z111 Encounter for screening for respiratory tuberculosis: Secondary | ICD-10-CM | POA: Diagnosis not present

## 2019-06-25 ENCOUNTER — Telehealth: Payer: Self-pay

## 2019-06-25 NOTE — Patient Instructions (Signed)
Thank you for choosing Primary Care at [facility] to be your medical home!    Judy Quinn was seen by De Hollingshead, DO today.   Judy Quinn's primary care provider is Marcy Siren, DO.   For the best care possible, you should try to see Marcy Siren, DO whenever you come to the clinic.   We look forward to seeing you again soon!  If you have any questions about your visit today, please call us at [phone] or feel free to reach your primary care provider via MyChart.

## 2019-06-25 NOTE — Telephone Encounter (Signed)
Called patient to do their pre-visit COVID screening.  Have you tested positive for COVID or are you currently waiting for COVID test results? no  Have you recently traveled internationally([place], [place], [place], [place], [place]) or within the [place] to a hotspot area([place], [place], [place], [place], [place])? no  Are you currently experiencing any of the following symptoms: fever, cough, SHOB, fatigue, body aches, loss of smell/taste, rash, diarrhea, vomiting, severe headaches, weakness, sore throat? no  Have you been in contact with anyone who has recently travelled? no  Have you been in contact with anyone who is experiencing any of the above symptoms or been diagnosed with COVID  or works in or has recently visited a SNF? No  States that due to lack of childcare she will need to have son with her. Aware he will need to wear a mask.

## 2019-06-26 ENCOUNTER — Encounter: Payer: Self-pay | Admitting: Internal Medicine

## 2019-06-26 ENCOUNTER — Ambulatory Visit (INDEPENDENT_AMBULATORY_CARE_PROVIDER_SITE_OTHER): Payer: Medicaid Other | Admitting: Internal Medicine

## 2019-06-26 ENCOUNTER — Other Ambulatory Visit: Payer: Self-pay

## 2019-06-26 VITALS — BP 125/86 | HR 64 | Temp 97.3°F | Resp 17 | Ht 70.0 in | Wt 269.0 lb

## 2019-06-26 DIAGNOSIS — Z3009 Encounter for other general counseling and advice on contraception: Secondary | ICD-10-CM | POA: Diagnosis not present

## 2019-06-26 DIAGNOSIS — E669 Obesity, unspecified: Secondary | ICD-10-CM

## 2019-06-26 DIAGNOSIS — Z6838 Body mass index (BMI) 38.0-38.9, adult: Secondary | ICD-10-CM

## 2019-06-26 DIAGNOSIS — Z30016 Encounter for initial prescription of transdermal patch hormonal contraceptive device: Secondary | ICD-10-CM

## 2019-06-26 MED ORDER — ESTRADIOL 0.05 MG/24HR TD PTWK: 0.0500 mg | MEDICATED_PATCH | TRANSDERMAL | 12 refills | Status: DC

## 2019-06-26 NOTE — Progress Notes (Signed)
°  Subjective:    Judy Quinn - 29 y.o. female MRN [id number]  Date of birth: 01-13-1991  HPI  Judy Quinn is here for acute concerns.  She wants to start new method of contraception. Had Nexplanon in place previously but had it removed in Feb 2021 due to concerns about weight gain and mood swings. Used Depo in past, 1-2x, but experienced weight gain. She liked OCPs in terms of lack of side effects but had trouble remembering to take her pill.   She is interested in weight loss. Plans to establish with [facility].    Health Maintenance:  Health Maintenance Due  Topic Date Due   Hepatitis C Screening  Never done   COVID-19 Vaccine (1) Never done    -  reports that she has quit smoking. She has never used smokeless tobacco. - Review of Systems: Per HPI. 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    Appearance: She is not diaphoretic.  Cardiovascular:     Rate and Rhythm: Normal rate.  Pulmonary:     Effort: Pulmonary effort is normal. No respiratory distress.  Musculoskeletal:        General: Normal range of motion.  Skin:    General: Skin is warm and dry.  Neurological:     Mental Status: She is alert and oriented to person, place, and time.  Psychiatric:        Mood and Affect: Affect normal.        Judgment: Judgment normal.            Assessment & Plan:   1. Encounter for initial  prescription of transdermal patch hormonal contraceptive device 2. Encounter for other general counseling or advice on contraception We discussed contraception options and patient ultimately decided on IUD placement. However, needs contraception Rx in the interim. We discussed OCPs, patch, and ring. She decided to use patch. Not a current smoker. Does not have history of VTE or HTN. Instructed to use back up method of birth control when first initiating patch. Continue use through appointment date to have IUD placed.  - estradiol (CLIMARA - DOSED IN MG/24 HR) 0.05 mg/24hr patch; Place 1 patch (0.05 mg total) onto the skin once a week.  Dispense: 4 patch; Refill: 12  3. Class 2 obesity without serious comorbidity with body mass index (BMI) of 38.0 to 38.9 in adult, unspecified obesity type - Amb Ref to Medical Weight Management - Amb Referral to Bariatric Surgery   04/17/2019, D.O. 06/26/2019, 10:13 AM Primary Care at [facility]

## 2019-07-09 ENCOUNTER — Other Ambulatory Visit: Payer: Medicaid Other

## 2019-07-14 ENCOUNTER — Ambulatory Visit: Payer: Medicaid Other | Attending: Internal Medicine

## 2019-07-14 DIAGNOSIS — Z20822 Contact with and (suspected) exposure to covid-19: Secondary | ICD-10-CM | POA: Diagnosis not present

## 2019-07-15 ENCOUNTER — Ambulatory Visit: Payer: Medicaid Other | Attending: Internal Medicine

## 2019-07-15 LAB — SARS-COV-2, NAA 2 DAY TAT

## 2019-07-15 LAB — NOVEL CORONAVIRUS, NAA: SARS-CoV-2, NAA: NOT DETECTED

## 2019-07-17 ENCOUNTER — Ambulatory Visit: Payer: Medicaid Other

## 2019-07-21 ENCOUNTER — Ambulatory Visit: Payer: Medicaid Other

## 2019-07-29 ENCOUNTER — Ambulatory Visit: Payer: Medicaid Other | Attending: Family

## 2019-07-29 DIAGNOSIS — Z23 Encounter for immunization: Secondary | ICD-10-CM

## 2019-07-29 NOTE — Progress Notes (Signed)
   Covid-19 Vaccination Clinic  Name:  LARON ANGELINI    MRN: [id number] DOB: September 09, 1990  07/29/2019  Ms. Towson was observed post Covid-19 immunization for 15 minutes without incident. She was provided with Vaccine Information Sheet and instruction to access the V-Safe system.   Ms. Tapley was instructed to call 911 with any severe reactions post vaccine: Marland Kitchen Difficulty breathing  . Swelling of face and throat  . A fast heartbeat  . A bad rash all over body  . Dizziness and weakness   Immunizations Administered    Name Date Dose VIS Date Route   Pfizer COVID-19 Vaccine 07/29/2019 12:54 PM 0.3 mL 03/12/2018 Intramuscular   Manufacturer: [employer], [employer]   Lot: [id number]   NDC: [address]-1

## 2019-08-01 ENCOUNTER — Ambulatory Visit: Payer: Medicaid Other | Admitting: Internal Medicine

## 2019-08-06 ENCOUNTER — Ambulatory Visit: Payer: Medicaid Other | Admitting: Internal Medicine

## 2019-08-29 ENCOUNTER — Other Ambulatory Visit: Payer: Medicaid Other

## 2019-09-01 DIAGNOSIS — H5213 Myopia, bilateral: Secondary | ICD-10-CM | POA: Diagnosis not present

## 2019-09-11 ENCOUNTER — Ambulatory Visit: Payer: Medicaid Other | Admitting: Internal Medicine

## 2019-09-12 ENCOUNTER — Other Ambulatory Visit: Payer: Self-pay | Admitting: Surgical Oncology

## 2019-09-12 ENCOUNTER — Other Ambulatory Visit: Payer: Medicaid Other

## 2019-09-12 DIAGNOSIS — K449 Diaphragmatic hernia without obstruction or gangrene: Secondary | ICD-10-CM

## 2019-09-19 ENCOUNTER — Other Ambulatory Visit: Payer: Self-pay | Admitting: Surgical Oncology

## 2019-09-30 DIAGNOSIS — Z6841 Body Mass Index (BMI) 40.0 and over, adult: Secondary | ICD-10-CM | POA: Diagnosis not present

## 2019-09-30 DIAGNOSIS — D509 Iron deficiency anemia, unspecified: Secondary | ICD-10-CM | POA: Diagnosis not present

## 2019-09-30 DIAGNOSIS — Z713 Dietary counseling and surveillance: Secondary | ICD-10-CM | POA: Diagnosis not present

## 2019-09-30 DIAGNOSIS — Z87891 Personal history of nicotine dependence: Secondary | ICD-10-CM | POA: Diagnosis not present

## 2019-09-30 DIAGNOSIS — R635 Abnormal weight gain: Secondary | ICD-10-CM | POA: Diagnosis not present

## 2019-10-01 ENCOUNTER — Other Ambulatory Visit: Payer: Self-pay | Admitting: Surgical Oncology

## 2019-10-01 ENCOUNTER — Ambulatory Visit
Admission: RE | Admit: 2019-10-01 | Discharge: 2019-10-01 | Disposition: A | Discharge status: Home / Self Care | Payer: Medicaid Other | Source: Ambulatory Visit | Attending: Surgical Oncology | Admitting: Surgical Oncology

## 2019-10-01 DIAGNOSIS — Z9884 Bariatric surgery status: Secondary | ICD-10-CM | POA: Diagnosis not present

## 2019-10-01 DIAGNOSIS — Z01818 Encounter for other preprocedural examination: Secondary | ICD-10-CM | POA: Diagnosis not present

## 2019-10-02 ENCOUNTER — Ambulatory Visit: Payer: Medicaid Other | Admitting: Internal Medicine

## 2019-10-16 ENCOUNTER — Encounter: Payer: Self-pay | Admitting: Internal Medicine

## 2019-10-16 ENCOUNTER — Other Ambulatory Visit: Payer: Self-pay

## 2019-10-16 ENCOUNTER — Other Ambulatory Visit (HOSPITAL_COMMUNITY)
Admission: RE | Admit: 2019-10-16 | Discharge: 2019-10-16 | Disposition: A | Discharge status: Home / Self Care | Payer: Medicaid Other | Attending: Internal Medicine | Admitting: Internal Medicine

## 2019-10-16 ENCOUNTER — Ambulatory Visit (INDEPENDENT_AMBULATORY_CARE_PROVIDER_SITE_OTHER): Payer: Medicaid Other | Admitting: Internal Medicine

## 2019-10-16 VITALS — BP 123/83 | HR 63 | Temp 97.3°F | Resp 17 | Wt 268.0 lb

## 2019-10-16 DIAGNOSIS — Z30430 Encounter for insertion of intrauterine contraceptive device: Secondary | ICD-10-CM | POA: Diagnosis not present

## 2019-10-16 DIAGNOSIS — Z113 Encounter for screening for infections with a predominantly sexual mode of transmission: Secondary | ICD-10-CM | POA: Insufficient documentation

## 2019-10-16 DIAGNOSIS — Z124 Encounter for screening for malignant neoplasm of cervix: Secondary | ICD-10-CM | POA: Diagnosis not present

## 2019-10-16 LAB — POCT URINE PREGNANCY: Preg Test, Ur: NEGATIVE

## 2019-10-16 MED ORDER — LEVONORGESTREL 20 MCG/24HR IU IUD
1.0000 | INTRAUTERINE_SYSTEM | Freq: Once | INTRAUTERINE | Status: AC
  Administered 2019-10-16: 1 via INTRAUTERINE

## 2019-10-16 NOTE — Progress Notes (Signed)
Subjective:    Judy Quinn - 29 y.o. female MRN [id number]  Date of birth: 10/03/1990  HPI  Judy Quinn is here for PAP and for insertion of IUD. Was prescribed patch for birth control method---has not been using. Wishes to have IUD inserted. Asks for STD screening. History of 3 vaginal deliveries. No history of surgical procedures to cervix.      Health Maintenance:  Health Maintenance Due  Topic Date Due  . Hepatitis C Screening  Never done  . INFLUENZA VACCINE  08/17/2019  . COVID-19 Vaccine (2 - Pfizer 2-dose series) 08/19/2019    -  reports that she has quit smoking. She has never used smokeless tobacco. - Review of Systems: Per HPI. - Past Medical History: Patient Active Problem List   Diagnosis Date Noted  . Status post repeat low transverse cesarean section 01/18/2018  . Morbid obesity (HCC) 01/17/2018  . History of classical cesarean section 01/17/2018  . 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    Appearance: She is not diaphoretic.  Cardiovascular:     Rate and Rhythm: Normal rate.  Pulmonary:     Effort: Pulmonary effort is normal. No respiratory distress.  Genitourinary:    Comments: GU/GYN: Exam performed in the presence of a chaperone. External genitalia within normal limits.  Vaginal mucosa pink, moist, normal rugae.  Multiparous, nonfriable cervix without lesions, no discharge or bleeding noted on speculum exam.  Bimanual exam revealed normal, nongravid uterus.  No cervical motion tenderness. No adnexal masses bilaterally.   Musculoskeletal:        General: Normal range of motion.  Skin:    General: Skin is warm and dry.   Neurological:     Mental Status: She is alert and oriented to person, place, and time.  Psychiatric:        Mood and Affect: Affect normal.        Judgment: Judgment normal.     IUD Insertion Procedure Note Patient identified, informed consent performed, consent signed.   Discussed risks of irregular bleeding, cramping, infection, malpositioning or misplacement of the IUD outside the uterus which may require further procedure such as laparoscopy. Time out was performed.  Urine pregnancy test negative.  Speculum placed in the vagina.  Cervix visualized.  Cleaned with Betadine x 2.  Grasped anteriorly with a single tooth tenaculum.  Uterus sounded to 7 cm.  Mirena IUD placed per manufacturer's recommendations.  Strings trimmed to 3 cm. Tenaculum was removed, good hemostasis noted.  Patient tolerated procedure well.      Assessment & Plan:   1. Pap smear for cervical cancer screening - Cytology - PAP([facility])  2. Encounter for insertion of mirena IUD Upreg negative. Patient tolerated procedure well with some cramping. Patient was given post-procedure instructions.  She was advised to use back up method of contraception for at least one week.  Patient was also asked to check IUD strings periodically and follow up in 4 weeks for IUD check. - levonorgestrel (MIRENA) 20 MCG/24HR IUD 1 each  3. Screening for STDs (  sexually transmitted diseases) - Cervicovaginal ancillary only     Marcy Siren, D.O. 10/16/2019, 11:41 AM Primary Care at [facility]

## 2019-10-17 LAB — CERVICOVAGINAL ANCILLARY ONLY
Bacterial Vaginitis (gardnerella): POSITIVE — AB
Candida Glabrata: NEGATIVE
Candida Vaginitis: NEGATIVE
Chlamydia: NEGATIVE
Comment: NEGATIVE
Comment: NEGATIVE
Comment: NEGATIVE
Comment: NEGATIVE
Comment: NEGATIVE
Comment: NORMAL
Neisseria Gonorrhea: NEGATIVE
Trichomonas: NEGATIVE

## 2019-10-20 DIAGNOSIS — Z713 Dietary counseling and surveillance: Secondary | ICD-10-CM | POA: Diagnosis not present

## 2019-10-20 LAB — CYTOLOGY - PAP
Adequacy: ABSENT
Comment: NEGATIVE
Diagnosis: NEGATIVE
High risk HPV: NEGATIVE

## 2019-10-21 ENCOUNTER — Other Ambulatory Visit: Payer: Self-pay | Admitting: Internal Medicine

## 2019-10-21 MED ORDER — METRONIDAZOLE 500 MG PO TABS: 500.0000 mg | ORAL_TABLET | Freq: Two times a day (BID) | ORAL | 0 refills | Status: DC

## 2019-11-04 DIAGNOSIS — Z6841 Body Mass Index (BMI) 40.0 and over, adult: Secondary | ICD-10-CM | POA: Diagnosis not present

## 2019-11-04 DIAGNOSIS — Z87891 Personal history of nicotine dependence: Secondary | ICD-10-CM | POA: Diagnosis not present

## 2019-11-20 ENCOUNTER — Ambulatory Visit: Payer: Medicaid Other | Admitting: Internal Medicine

## 2019-12-10 DIAGNOSIS — E669 Obesity, unspecified: Secondary | ICD-10-CM | POA: Diagnosis not present

## 2019-12-10 DIAGNOSIS — Z713 Dietary counseling and surveillance: Secondary | ICD-10-CM | POA: Diagnosis not present

## 2019-12-10 DIAGNOSIS — D509 Iron deficiency anemia, unspecified: Secondary | ICD-10-CM | POA: Diagnosis not present

## 2019-12-10 DIAGNOSIS — Z87891 Personal history of nicotine dependence: Secondary | ICD-10-CM | POA: Diagnosis not present

## 2019-12-10 DIAGNOSIS — Z6841 Body Mass Index (BMI) 40.0 and over, adult: Secondary | ICD-10-CM | POA: Diagnosis not present

## 2019-12-16 ENCOUNTER — Other Ambulatory Visit: Payer: Self-pay

## 2019-12-16 ENCOUNTER — Ambulatory Visit (INDEPENDENT_AMBULATORY_CARE_PROVIDER_SITE_OTHER): Payer: Medicaid Other | Admitting: Internal Medicine

## 2019-12-16 ENCOUNTER — Encounter: Payer: Self-pay | Admitting: Internal Medicine

## 2019-12-16 VITALS — BP 134/86 | HR 86 | Temp 97.3°F | Resp 17 | Wt 260.0 lb

## 2019-12-16 DIAGNOSIS — Z30431 Encounter for routine checking of intrauterine contraceptive device: Secondary | ICD-10-CM

## 2019-12-16 DIAGNOSIS — N921 Excessive and frequent menstruation with irregular cycle: Secondary | ICD-10-CM | POA: Diagnosis not present

## 2019-12-16 DIAGNOSIS — Z30011 Encounter for initial prescription of contraceptive pills: Secondary | ICD-10-CM | POA: Diagnosis not present

## 2019-12-16 DIAGNOSIS — Z975 Presence of (intrauterine) contraceptive device: Secondary | ICD-10-CM

## 2019-12-16 LAB — POCT URINE PREGNANCY: Preg Test, Ur: NEGATIVE

## 2019-12-16 MED ORDER — NORETHIN ACE-ETH ESTRAD-FE 1-20 MG-MCG(24) PO TABS: 1.0000 | ORAL_TABLET | Freq: Every day | ORAL | 11 refills | Status: DC

## 2019-12-16 NOTE — Patient Instructions (Signed)
How to take oral contraceptive pills Options to start taking your first cycle of OCPs: 1. Start the pill on day 1 of your menstrual period. If you start at this time, you will not need any backup form of birth control (contraception), such as condoms. 2. Start the pill on the first Sunday after your menstrual period or on the day you get your prescription. In these cases, you will need to use backup contraception for the first week. 3. Start the pill at any time of your cycle. ? If you take the pill within 5 days of the start of your period, you will not need a backup form of contraception. ? If you start at any other time of your menstrual cycle, you will need to use another form of contraception for 7 days. If your OCP is the type called a minipill, it will protect you from pregnancy after taking it for 2 days (48 hours), and you can stop using backup contraception after that time. After you have started taking OCPs:  If you forget to take 1 pill, take it as soon as you remember. Take the next pill at the regular time.  If you miss 2 or more pills, call your health care provider. Different pills have different instructions for missed doses. Use backup birth control until your next menstrual period starts.  If you use a 28-day pack that contains inactive pills and you miss 1 of the last 7 pills (pills with no hormones), throw away the rest of the non-hormone pills and start a new pill pack. No matter which day you start the OCP, you will always start a new pack on that same day of the week. Have an extra pack of OCPs and a backup contraceptive method available in case you miss some pills or lose your OCP pack. 

## 2019-12-16 NOTE — Progress Notes (Signed)
  Subjective:    SIHAAM CHROBAK - 29 y.o. female MRN [id number]  Date of birth: Jun 20, 1990  HPI  BRANIYA FARRUGIA is here for IUD string check for Mirena inserted on 10/16/19. Patient reports that she has had bleeding since insertion except for one week in November and now where she is more spotting instead of bleeding heavily. She denies cramping. Notes some discomfort in the vagina.      Health Maintenance:  Health Maintenance Due  Topic Date Due  . INFLUENZA VACCINE  08/17/2019    -  reports that she has quit smoking. She has never used smokeless tobacco. - Review of Systems: Per HPI. - Past Medical History: Patient Active Problem List   Diagnosis Date Noted  . Status post repeat low transverse cesarean section 01/18/2018  . Morbid obesity (HCC) 01/17/2018  . History of classical cesarean section 01/17/2018  . 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 External genitalia within normal limits.  Vaginal mucosa pink, moist, normal rugae.  Mild bleeding from cervical os noted. Both IUD arms noted to be rotated through the cervical os and protruding. IUD removed intact.       Assessment & Plan:   1. Encounter for routine checking of intrauterine contraceptive device (IUD) IUD noted to be malpositioned and protruding through cervical os into vaginal vault. Removed.  2. Breakthrough bleeding associated with intrauterine device (IUD) Likely related to malposition.   3. Initiation of oral contraception Rather than place new IUD today, patient elected to start OCP. Upreg neg. Counseled on how to start Rx and potential need for back up method as doubt IUD was providing appropriate  contraception due to malposition. Smokes socially--discussed this can increase risk of VTE with OCPs and if still smoking at age of 29 would be contraindication to continuing OCP. No history of migraine with aura, HTN, or VTE.  - POCT urine pregnancy - Norethindrone Acetate-Ethinyl Estrad-FE (LOESTRIN 24 FE) 1-20 MG-MCG(24) tablet; Take 1 tablet by mouth daily.  Dispense: 28 tablet; Refill: 11      Marcy Siren, D.O. 12/16/2019, 2:20 PM Primary Care at [facility]

## 2019-12-19 ENCOUNTER — Ambulatory Visit (HOSPITAL_COMMUNITY)
Admission: EM | Admit: 2019-12-19 | Discharge: 2019-12-19 | Disposition: A | Discharge status: Home / Self Care | Payer: Medicaid Other | Attending: Family Medicine | Admitting: Family Medicine

## 2019-12-19 ENCOUNTER — Other Ambulatory Visit: Payer: Self-pay

## 2019-12-19 ENCOUNTER — Encounter (HOSPITAL_COMMUNITY): Payer: Self-pay | Admitting: Emergency Medicine

## 2019-12-19 DIAGNOSIS — N898 Other specified noninflammatory disorders of vagina: Secondary | ICD-10-CM | POA: Diagnosis not present

## 2019-12-19 LAB — POCT URINALYSIS DIPSTICK, ED / UC
Bilirubin Urine: NEGATIVE
Glucose, UA: NEGATIVE mg/dL
Ketones, ur: NEGATIVE mg/dL
Leukocytes,Ua: NEGATIVE
Nitrite: NEGATIVE
Protein, ur: NEGATIVE mg/dL
Specific Gravity, Urine: 1.02 (ref 1.005–1.030)
Urobilinogen, UA: 1 mg/dL (ref 0.0–1.0)
pH: 7 (ref 5.0–8.0)

## 2019-12-19 MED ORDER — METRONIDAZOLE 500 MG PO TABS: 500.0000 mg | ORAL_TABLET | Freq: Two times a day (BID) | ORAL | 0 refills | Status: DC

## 2019-12-19 NOTE — ED Provider Notes (Signed)
[facility]    CSN: [id number] Arrival date & time: 12/19/19  1016      History   Chief Complaint Chief Complaint  Patient presents with  . Vaginal Discharge    HPI Judy Quinn is a 29 y.o. female.   Pt is a 29 year old female that presents with vaginal discharge. This has been present for the past 2 days. White vaginal discharge with odor. Hx of BV. Would like to be checked for STDs.  Mild lower abdominal cramping.  No dysuria, hematuria. Mild urinary frequency.  Had IUD removed on Tuesday due to bleeding     Past Medical History:  Diagnosis Date  . Medical history non-contributory     Patient Active Problem List   Diagnosis Date Noted  . Status post repeat low transverse cesarean section 01/18/2018  . Morbid obesity (HCC) 01/17/2018  . History of classical cesarean section 01/17/2018  . 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 CESAREAN SECTION    . CESAREAN SECTION N/A 01/18/2018   Procedure: REPEAT CESAREAN SECTION;  Surgeon: Osborn Coho, MD;  Location: [facility] BIRTHING SUITES;  Service: Obstetrics;  Laterality: N/A;    OB History    Gravida  3   Para  3   Term  1   Preterm  2   AB      Living  3     SAB      TAB      Ectopic      Multiple  0   Live Births  1            Home Medications    Prior to Admission medications   Medication Sig Start Date End Date Taking? Authorizing Provider  metroNIDAZOLE (FLAGYL) 500 MG tablet Take 1 tablet (500 mg total) by mouth 2 (two) times daily. 12/19/19   Janace Aris, NP  Multiple Vitamin (QUINTABS) TABS Take 1 tablet by mouth daily.    [provider]  Norethindrone Acetate-Ethinyl Estrad-FE (LOESTRIN 24 FE) 1-20 MG-MCG(24) tablet Take 1 tablet by mouth daily. 12/16/19   Arvilla Market, DO  Phentermine HCl 8 MG TABS One po daily at noon 11/04/19   [provider]  topiramate (TOPAMAX) 25 MG  tablet Take 1 tablet by mouth daily. 11/04/19   [provider]    Family History Family History  Problem Relation Age of Onset  . Hypertension Mother     Social History Social History   Tobacco Use  . Smoking status: Former Games developer  . Smokeless tobacco: Never Used  Vaping Use  . Vaping Use: Never used  Substance Use Topics  . Alcohol use: Not Currently  . Drug use: Never     Allergies   Patient has no known allergies.   Review of Systems Review of Systems   Physical Exam Triage Vital Signs ED Triage Vitals  Enc Vitals Group     BP 12/19/19 1116 (!) 156/109     Pulse Rate 12/19/19 1116 85     Resp 12/19/19 1116 18     Temp 12/19/19 1116 98.8 F (37.1 C)     Temp Source 12/19/19 1116 Temporal     SpO2 12/19/19 1116 100 %     Weight --      Height --      Head Circumference --      Peak Flow --      Pain Score  12/19/19 1117 3     Pain Loc --      Pain Edu? --      Excl. in GC? --    No data found.  Updated Vital Signs BP (!) 156/109 (BP Location: Right Wrist)   Pulse 85   Temp 98.8 F (37.1 C) (Temporal)   Resp 18   SpO2 100%   Visual Acuity Right Eye Distance:   Left Eye Distance:   Bilateral Distance:    Right Eye Near:   Left Eye Near:    Bilateral Near:     Physical Exam Vitals and nursing note reviewed.  Constitutional:      General: She is not in acute distress.    Appearance: Normal appearance. She is not ill-appearing, toxic-appearing or diaphoretic.  HENT:     Head: Normocephalic.     Nose: Nose normal.  Eyes:     Conjunctiva/sclera: Conjunctivae normal.  Pulmonary:     Effort: Pulmonary effort is normal.  Musculoskeletal:        General: Normal range of motion.     Cervical back: Normal range of motion.  Skin:    General: Skin is warm and dry.     Findings: No rash.  Neurological:     Mental Status: She is alert.  Psychiatric:        Mood and Affect: Mood normal.      UC Treatments / Results  Labs (all  labs ordered are listed, but only abnormal results are displayed) Labs Reviewed  POCT URINALYSIS DIPSTICK, ED / UC - Abnormal; Notable for the following components:      Result Value   Hgb urine dipstick TRACE (*)    All other components within normal limits  CERVICOVAGINAL ANCILLARY ONLY    EKG   Radiology No results found.  Procedures Procedures (including critical care time)  Medications Ordered in UC Medications - No data to display  Initial Impression / Assessment and Plan / UC Course  I have reviewed the triage vital signs and the nursing notes.  Pertinent labs & imaging results that were available during my care of the patient were reviewed by me and considered in my medical decision making (see chart for details).     Vaginal discharge We will treat for BV at this time based on history and symptoms.  Swab sent for testing.  Urine without any infection today. Follow up as needed for continued or worsening symptoms  Final Clinical Impressions(s) / UC Diagnoses   Final diagnoses:  Vaginal discharge     Discharge Instructions     Treating you for BV based on symptoms and history.  We will send a swab for further testing. Your urine did not show any infection Follow up as needed for continued or worsening symptoms     ED Prescriptions    Medication Sig Dispense Auth. Provider   metroNIDAZOLE (FLAGYL) 500 MG tablet Take 1 tablet (500 mg total) by mouth 2 (two) times daily. 14 tablet Shalva Rozycki A, NP     PDMP not reviewed this encounter.   Janace Aris, NP 12/19/19 1228

## 2019-12-19 NOTE — Discharge Instructions (Addendum)
Treating you for BV based on symptoms and history.  We will send a swab for further testing. Your urine did not show any infection Follow up as needed for continued or worsening symptoms

## 2019-12-19 NOTE — ED Triage Notes (Signed)
Patient presents to [facility] for assessment of vaginal discharge, lower abdominal pain x 2 days.  Sexually active, had IUD removed Tuesday, was bleeding for the past month while she had it in.

## 2019-12-22 LAB — CERVICOVAGINAL ANCILLARY ONLY
Bacterial Vaginitis (gardnerella): POSITIVE — AB
Candida Glabrata: NEGATIVE
Candida Vaginitis: NEGATIVE
Chlamydia: NEGATIVE
Comment: NEGATIVE
Comment: NEGATIVE
Comment: NEGATIVE
Comment: NEGATIVE
Comment: NEGATIVE
Comment: NORMAL
Neisseria Gonorrhea: NEGATIVE
Trichomonas: NEGATIVE

## 2019-12-25 ENCOUNTER — Ambulatory Visit: Payer: Medicaid Other | Admitting: Internal Medicine

## 2019-12-30 ENCOUNTER — Telehealth: Payer: Medicaid Other | Admitting: Internal Medicine

## 2020-01-07 ENCOUNTER — Ambulatory Visit (HOSPITAL_COMMUNITY)
Admission: EM | Admit: 2020-01-07 | Discharge: 2020-01-07 | Disposition: A | Discharge status: Home / Self Care | Payer: Medicaid Other | Attending: Emergency Medicine | Admitting: Emergency Medicine

## 2020-01-07 ENCOUNTER — Encounter (HOSPITAL_COMMUNITY): Payer: Self-pay | Admitting: Emergency Medicine

## 2020-01-07 ENCOUNTER — Other Ambulatory Visit: Payer: Self-pay

## 2020-01-07 DIAGNOSIS — N760 Acute vaginitis: Secondary | ICD-10-CM | POA: Insufficient documentation

## 2020-01-07 LAB — POCT URINALYSIS DIPSTICK, ED / UC
Bilirubin Urine: NEGATIVE
Glucose, UA: NEGATIVE mg/dL
Hgb urine dipstick: NEGATIVE
Ketones, ur: 15 mg/dL — AB
Leukocytes,Ua: NEGATIVE
Nitrite: NEGATIVE
Protein, ur: NEGATIVE mg/dL
Specific Gravity, Urine: 1.025 (ref 1.005–1.030)
Urobilinogen, UA: 1 mg/dL (ref 0.0–1.0)
pH: 7 (ref 5.0–8.0)

## 2020-01-07 MED ORDER — METRONIDAZOLE 500 MG PO TABS: 500.0000 mg | ORAL_TABLET | Freq: Two times a day (BID) | ORAL | 0 refills | Status: AC

## 2020-01-07 NOTE — ED Triage Notes (Signed)
Pt c/o of vaginal discomfort, discharge, fishy smell, was treated here 12/3 dx with Vaginosis, completed antibiotics, no relief.

## 2020-01-07 NOTE — ED Provider Notes (Signed)
[facility]    CSN: [id number] Arrival date & time: 01/07/20  1000      History   Chief Complaint Chief Complaint  Patient presents with  . Vaginal Discharge    HPI Judy Quinn is a 29 y.o. female.   [name] presents with complaints of vaginal discharge which is yellow and with malodor, as well as some vaginal irritation and itching. Pain with urination without urinary frequency. No fevers. Had similar 12/3 and completed course of flagyl which improved symptoms. They returned last night. No vaginal bleeding. No new concerns of stds. No known fevers.     ROS per HPI, negative if not otherwise mentioned.      Past Medical History:  Diagnosis Date  . Medical history non-contributory     Patient Active Problem List   Diagnosis Date Noted  . Status post repeat low transverse cesarean section 01/18/2018  . Morbid obesity (HCC) 01/17/2018  . History of classical cesarean section 01/17/2018  . 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 CESAREAN SECTION    . CESAREAN SECTION N/A 01/18/2018   Procedure: REPEAT CESAREAN SECTION;  Surgeon: Osborn Coho, MD;  Location: [facility] BIRTHING SUITES;  Service: Obstetrics;  Laterality: N/A;    OB History    Gravida  3   Para  3   Term  1   Preterm  2   AB      Living  3     SAB      IAB      Ectopic      Multiple  0   Live Births  1            Home Medications    Prior to Admission medications   Medication Sig Start Date End Date Taking? Authorizing Provider  Multiple Vitamin (QUINTABS) TABS Take 1 tablet by mouth daily.   Yes [provider]  Norethindrone Acetate-Ethinyl Estrad-FE (LOESTRIN 24 FE) 1-20 MG-MCG(24) tablet Take 1 tablet by mouth daily. 12/16/19  Yes Arvilla Market, DO  Phentermine HCl 8 MG TABS One po daily at noon 11/04/19  Yes [provider]  topiramate (TOPAMAX) 25 MG tablet  Take 1 tablet by mouth daily. 11/04/19  Yes [provider]  metroNIDAZOLE (FLAGYL) 500 MG tablet Take 1 tablet (500 mg total) by mouth 2 (two) times daily for 7 days. 01/07/20 01/14/20  Georgetta Haber, NP    Family History Family History  Problem Relation Age of Onset  . Hypertension Mother     Social History Social History   Tobacco Use  . Smoking status: Former Games developer  . Smokeless tobacco: Never Used  Vaping Use  . Vaping Use: Never used  Substance Use Topics  . Alcohol use: Yes    Comment: occassional  . Drug use: Never     Allergies   Patient has no known allergies.   Review of Systems Review of Systems   Physical Exam Triage Vital Signs ED Triage Vitals  Enc Vitals Group     BP 01/07/20 1112 (!) 144/94     Pulse Rate 01/07/20 1112 83     Resp 01/07/20 1112 20     Temp 01/07/20 1112 98.4 F (36.9 C)     Temp Source 01/07/20 1112 Oral     SpO2 01/07/20 1112 98 %     Weight --      Height --  Head Circumference --      Peak Flow --      Pain Score 01/07/20 1106 8     Pain Loc --      Pain Edu? --      Excl. in GC? --    No data found.  Updated Vital Signs BP (!) 144/94 (BP Location: Left Arm)   Pulse 83   Temp 98.4 F (36.9 C) (Oral)   Resp 20   LMP 12/29/2019   SpO2 98%   Visual Acuity Right Eye Distance:   Left Eye Distance:   Bilateral Distance:    Right Eye Near:   Left Eye Near:    Bilateral Near:     Physical Exam Constitutional:      General: She is not in acute distress.    Appearance: She is well-developed.  Cardiovascular:     Rate and Rhythm: Normal rate.  Pulmonary:     Effort: Pulmonary effort is normal.  Abdominal:     Palpations: Abdomen is not rigid.     Tenderness: There is no abdominal tenderness. There is no guarding or rebound.  Genitourinary:    Comments: Denies sores, lesions, vaginal bleeding; no pelvic pain; gu exam deferred at this time, vaginal self swab collected.   Skin:    General:  Skin is warm and dry.  Neurological:     Mental Status: She is alert and oriented to person, place, and time.      UC Treatments / Results  Labs (all labs ordered are listed, but only abnormal results are displayed) Labs Reviewed  POCT URINALYSIS DIPSTICK, ED / UC - Abnormal; Notable for the following components:      Result Value   Ketones, ur 15 (*)    All other components within normal limits  CERVICOVAGINAL ANCILLARY ONLY    EKG   Radiology No results found.  Procedures Procedures (including critical care time)  Medications Ordered in UC Medications - No data to display  Initial Impression / Assessment and Plan / UC Course  I have reviewed the triage vital signs and the nursing notes.  Pertinent labs & imaging results that were available during my care of the patient were reviewed by me and considered in my medical decision making (see chart for details).     Urine without indication of UTI. 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 Follow up recommendations discussed. Patient verbalized understanding and agreeable to plan.   Final Clinical Impressions(s) / UC Diagnoses   Final diagnoses:  Acute vaginitis     Discharge Instructions     Your urine looks well today, no indication of urinary tract infection.  I have started treatment for bacterial vaginosis again, pending the results from your vaginal swab.  We will call you if need to send any other medications (such as for yeast). If symptoms worsen or do not improve in the next week to return to be seen or to follow up with your PCP.      ED Prescriptions    Medication Sig Dispense Auth. Provider   metroNIDAZOLE (FLAGYL) 500 MG tablet Take 1 tablet (500 mg total) by mouth 2 (two) times daily for 7 days. 14 tablet Georgetta Haber, NP     PDMP not reviewed this encounter.   Georgetta Haber, NP 01/07/20 1235

## 2020-01-07 NOTE — Discharge Instructions (Addendum)
Your urine looks well today, no indication of urinary tract infection.  I have started treatment for bacterial vaginosis again, pending the results from your vaginal swab.  We will call you if need to send any other medications (such as for yeast). If symptoms worsen or do not improve in the next week to return to be seen or to follow up with your PCP.

## 2020-01-08 ENCOUNTER — Telehealth (HOSPITAL_COMMUNITY): Payer: Self-pay | Admitting: Emergency Medicine

## 2020-01-08 LAB — CERVICOVAGINAL ANCILLARY ONLY
Bacterial Vaginitis (gardnerella): POSITIVE — AB
Candida Glabrata: NEGATIVE
Candida Vaginitis: POSITIVE — AB
Chlamydia: NEGATIVE
Comment: NEGATIVE
Comment: NEGATIVE
Comment: NEGATIVE
Comment: NEGATIVE
Comment: NEGATIVE
Comment: NORMAL
Neisseria Gonorrhea: NEGATIVE
Trichomonas: NEGATIVE

## 2020-01-08 MED ORDER — FLUCONAZOLE 150 MG PO TABS: 150.0000 mg | ORAL_TABLET | Freq: Once | ORAL | 0 refills | Status: AC

## 2020-01-20 ENCOUNTER — Ambulatory Visit: Payer: Self-pay

## 2020-04-09 ENCOUNTER — Ambulatory Visit: Payer: Medicaid Other | Admitting: Podiatry

## 2020-04-20 ENCOUNTER — Other Ambulatory Visit: Payer: Self-pay

## 2020-04-20 ENCOUNTER — Ambulatory Visit (INDEPENDENT_AMBULATORY_CARE_PROVIDER_SITE_OTHER): Payer: Medicaid Other | Admitting: Podiatry

## 2020-04-20 DIAGNOSIS — B351 Tinea unguium: Secondary | ICD-10-CM | POA: Diagnosis not present

## 2020-04-20 DIAGNOSIS — L603 Nail dystrophy: Secondary | ICD-10-CM | POA: Diagnosis not present

## 2020-04-20 DIAGNOSIS — M79674 Pain in right toe(s): Secondary | ICD-10-CM

## 2020-04-20 MED ORDER — FLUCONAZOLE 150 MG PO TABS: 150.0000 mg | ORAL_TABLET | ORAL | 0 refills | Status: DC

## 2020-05-17 ENCOUNTER — Ambulatory Visit: Payer: Medicaid Other | Admitting: Family

## 2020-05-18 ENCOUNTER — Ambulatory Visit: Payer: Medicaid Other | Admitting: Podiatry

## 2020-05-20 NOTE — Progress Notes (Signed)
  Subjective:  Patient ID: SALAH BURLISON, female    DOB: March 24, 1990,  MRN: [id number]  Chief Complaint  Patient presents with  . Nail Problem    Right great toe nail is falling off.Pt states she hit her toe 2 weeks ago and nail is coming off. No pain just itching. Pt requested nail to be removed.     30 y.o. female presents with the above complaint. History confirmed with patient.   Objective:  Physical Exam: warm, good capillary refill, no trophic changes or ulcerative lesions, normal DP and PT pulses and normal sensory exam.  Right hallux nail thickened dystrophic distal lysis Assessment:   1. Onychomycosis   2. Dystrophic nail   3. Pain in toe of right foot    Plan:  Patient was evaluated and treated and all questions answered.  Onychomycosis -Educated etiology -Nail courtesy debrided of all loose nail -Start fluconazole weekly  Return in about 1 month (around 05/20/2020) for Nail Fungus.

## 2020-06-24 ENCOUNTER — Ambulatory Visit (HOSPITAL_COMMUNITY)
Admission: EM | Admit: 2020-06-24 | Discharge: 2020-06-24 | Disposition: A | Discharge status: Home / Self Care | Payer: Medicaid Other | Attending: Family Medicine | Admitting: Family Medicine

## 2020-06-24 ENCOUNTER — Encounter (HOSPITAL_COMMUNITY): Payer: Self-pay

## 2020-06-24 ENCOUNTER — Other Ambulatory Visit: Payer: Self-pay

## 2020-06-24 DIAGNOSIS — N760 Acute vaginitis: Secondary | ICD-10-CM

## 2020-06-24 DIAGNOSIS — B9689 Other specified bacterial agents as the cause of diseases classified elsewhere: Secondary | ICD-10-CM

## 2020-06-24 DIAGNOSIS — Z3202 Encounter for pregnancy test, result negative: Secondary | ICD-10-CM | POA: Diagnosis not present

## 2020-06-24 LAB — POC URINE PREG, ED: Preg Test, Ur: NEGATIVE

## 2020-06-24 LAB — POCT URINALYSIS DIPSTICK, ED / UC
Bilirubin Urine: NEGATIVE
Glucose, UA: NEGATIVE mg/dL
Ketones, ur: NEGATIVE mg/dL
Nitrite: NEGATIVE
Protein, ur: NEGATIVE mg/dL
Specific Gravity, Urine: 1.025 (ref 1.005–1.030)
Urobilinogen, UA: 1 mg/dL (ref 0.0–1.0)
pH: 6 (ref 5.0–8.0)

## 2020-06-24 MED ORDER — METRONIDAZOLE 500 MG PO TABS: 500.0000 mg | ORAL_TABLET | Freq: Two times a day (BID) | ORAL | 0 refills | Status: DC

## 2020-06-24 NOTE — ED Triage Notes (Signed)
Pt c/o lower abdominal pain x 3 days. She states she feels nauseas when eating and drinking. Pt states when she urinates she noticed a odor. Pt states she took Tylenol that's given her some relief. Pt states she took a pregnancy test at work that resulted negative. She states she is currently not on birth control.

## 2020-06-24 NOTE — ED Notes (Signed)
Pt unable to urinate at this moment. Given water.

## 2020-06-24 NOTE — Discharge Instructions (Addendum)
-  We are treating you for BV today.  Start the antibiotic, Flagyl twice daily for 7 days.  Make sure to avoid alcohol while taking this medication as it can cause severe nausea and vomiting.  If you have a sensitive stomach, try taking this medication with food to prevent nausea. -We are testing for BV and yeast, we will call you if this is positive and can send additional treatment if we need to. -Seek additional medical attention if symptoms get worse instead of better, this includes abdominal pain, back pain, vaginal discharge, new fever/chills.

## 2020-06-24 NOTE — ED Provider Notes (Signed)
[facility]    CSN: [id number] Arrival date & time: 06/24/20  4098      History   Chief Complaint Chief Complaint  Patient presents with   Abdominal Pain   Nausea    HPI Judy Quinn is a 30 y.o. female presenting with vaginal symptoms x3 days.  Medical history BV and yeast 12/2019, morbid obesity.  Describes 3 days of intermittent crampy lower abdominal pain, nausea with eating and drinking but no vomiting, foul odor with urination.  Thick white vaginal discharge. She did take a pregnancy test and this was negative.  Not taking any medications for birth control. Denies STI risk, same female partner. Denies hematuria, dysuria, frequency, urgency, back pain, n/v/d/abd pain, fevers/chills, abdnormal vaginal lesions.      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 Authorizing Provider  metroNIDAZOLE (FLAGYL) 500 MG tablet Take 1 tablet (500 mg total) by mouth 2 (two) times daily. 06/24/20  Yes Rhys Martini, PA-C  Multiple Vitamin Brandt Loosen) TABS Take 1 tablet by mouth daily.    [provider]    Family History Family History  Problem  Relation Age of Onset   Hypertension Mother     Social History Social History   Tobacco Use   Smoking status: Former    Pack years: 0.00   Smokeless tobacco: Never  Vaping Use   Vaping Use: Never used  Substance Use Topics   Alcohol use: Yes    Comment: occassional   Drug use: Never     Allergies   Patient has no known allergies.   Review of Systems Review of Systems  Constitutional:  Negative for chills and fever.  HENT:  Negative for sore throat.   Eyes:  Negative for pain and redness.  Respiratory:  Negative for shortness of breath.   Cardiovascular:  Negative for chest pain.  Gastrointestinal:  Negative for abdominal pain, diarrhea, nausea and vomiting.  Genitourinary:  Positive for vaginal discharge. Negative for decreased urine volume, difficulty urinating, dysuria, flank pain, frequency, genital sores, hematuria, menstrual problem, pelvic pain, urgency, vaginal bleeding and vaginal pain.  Musculoskeletal:  Negative for back pain.  Skin:  Negative for rash.  All other systems reviewed and are negative.   Physical Exam Triage Vital Signs ED Triage Vitals  Enc Vitals Group     BP 06/24/20 1034 (!) 131/93  Pulse Rate 06/24/20 1034 (!) 105     Resp 06/24/20 1034 20     Temp 06/24/20 1034 98.5 F (36.9 C)     Temp Source 06/24/20 1034 Oral     SpO2 06/24/20 1034 100 %     Weight --      Height --      Head Circumference --      Peak Flow --      Pain Score 06/24/20 1033 7     Pain Loc --      Pain Edu? --      Excl. in GC? --    No data found.  Updated Vital Signs BP (!) 131/93 (BP Location: Right Arm)   Pulse (!) 105   Temp 98.5 F (36.9 C) (Oral)   Resp 20   LMP 04/28/2020 (Exact Date)   SpO2 100%   Visual Acuity Right Eye Distance:   Left Eye Distance:   Bilateral Distance:    Right Eye Near:   Left Eye Near:    Bilateral Near:     Physical Exam Vitals reviewed.  Constitutional:      General: She is not in acute distress.     Appearance: Normal appearance. She is not ill-appearing.  HENT:     Head: Normocephalic and atraumatic.     Mouth/Throat:     Mouth: Mucous membranes are moist.     Comments: Moist mucous membranes Eyes:     Extraocular Movements: Extraocular movements intact.     Pupils: Pupils are equal, round, and reactive to light.  Cardiovascular:     Rate and Rhythm: Normal rate and regular rhythm.     Heart sounds: Normal heart sounds.  Pulmonary:     Effort: Pulmonary effort is normal.     Breath sounds: Normal breath sounds. No wheezing, rhonchi or rales.  Abdominal:     General: Bowel sounds are normal. There is no distension.     Palpations: Abdomen is soft. There is no mass.     Tenderness: There is no abdominal tenderness. There is no right CVA tenderness, left CVA tenderness, guarding or rebound. Negative signs include Murphy's sign, Rovsing's sign and McBurney's sign.     Comments: deferred  Skin:    General: Skin is warm.     Capillary Refill: Capillary refill takes less than 2 seconds.     Comments: Good skin turgor  Neurological:     General: No focal deficit present.     Mental Status: She is alert and oriented to person, place, and time.  Psychiatric:        Mood and Affect: Mood normal.        Behavior: Behavior normal.     UC Treatments / Results  Labs (all labs ordered are listed, but only abnormal results are displayed) Labs Reviewed  POCT URINALYSIS DIPSTICK, ED / UC - Abnormal; Notable for the following components:      Result Value   Hgb urine dipstick SMALL (*)    Leukocytes,Ua TRACE (*)    All other components within normal limits  URINE CULTURE  POC URINE PREG, ED  CERVICOVAGINAL ANCILLARY ONLY    EKG   Radiology No results found.  Procedures Procedures (including critical care time)  Medications Ordered in UC Medications - No data to display  Initial Impression / Assessment and Plan / UC Course  I have reviewed the triage vital signs and the  nursing notes.  Pertinent labs & imaging results that  were available during my care of the patient were reviewed by me and considered in my medical decision making (see chart for details).    This patient is a 30 year old female presenting with suspected BV.  Afebrile, nontachycardic, no abdominal pain or CVAT Urine pregnancy negative. UA with small blood, trace leuk.  Culture sent. Low suspicion for UTI given lack of urinary symptoms. Self swab sent for BV, yeast, gonorrhea, trichomonas, chlamydia.  Declines STI risk so we will defer HIV and syphilis Flagyl sent as below. ED return precautions discussed. Coding this visit a level 4 for review of past notes (12/2019), past labs (12/2019, ordering of tests, prescription drug management.  Final Clinical Impressions(s) / UC Diagnoses   Final diagnoses:  Bacterial vaginosis  Negative pregnancy test     Discharge Instructions      -We are treating you for BV today.  Start the antibiotic, Flagyl twice daily for 7 days.  Make sure to avoid alcohol while taking this medication as it can cause severe nausea and vomiting.  If you have a sensitive stomach, try taking this medication with food to prevent nausea. -We are testing for BV and yeast, we will call you if this is positive and can send additional treatment if we need to. -Seek additional medical attention if symptoms get worse instead of better, this includes abdominal pain, back pain, vaginal discharge, new fever/chills.     ED Prescriptions     Medication Sig Dispense Auth. Provider   metroNIDAZOLE (FLAGYL) 500 MG tablet Take 1 tablet (500 mg total) by mouth 2 (two) times daily. 14 tablet Rhys Martini, PA-C      PDMP not reviewed this encounter.   Rhys Martini, PA-C 06/24/20 1237

## 2020-06-24 NOTE — ED Notes (Signed)
Pt unable to provide urine sample at this time, provided with water.

## 2020-06-25 LAB — CERVICOVAGINAL ANCILLARY ONLY
Bacterial Vaginitis (gardnerella): POSITIVE — AB
Candida Glabrata: NEGATIVE
Candida Vaginitis: NEGATIVE
Chlamydia: NEGATIVE
Comment: NEGATIVE
Comment: NEGATIVE
Comment: NEGATIVE
Comment: NEGATIVE
Comment: NEGATIVE
Comment: NORMAL
Neisseria Gonorrhea: NEGATIVE
Trichomonas: NEGATIVE

## 2020-06-25 LAB — URINE CULTURE

## 2020-08-05 ENCOUNTER — Encounter: Payer: Self-pay | Admitting: Family

## 2020-08-05 ENCOUNTER — Ambulatory Visit (INDEPENDENT_AMBULATORY_CARE_PROVIDER_SITE_OTHER): Payer: Medicaid Other | Admitting: Family

## 2020-08-05 ENCOUNTER — Other Ambulatory Visit: Payer: Self-pay

## 2020-08-05 VITALS — BP 125/83 | HR 89 | Temp 98.1°F | Resp 17 | Wt 240.4 lb

## 2020-08-05 DIAGNOSIS — Z3200 Encounter for pregnancy test, result unknown: Secondary | ICD-10-CM

## 2020-08-05 DIAGNOSIS — R300 Dysuria: Secondary | ICD-10-CM

## 2020-08-05 DIAGNOSIS — Z3202 Encounter for pregnancy test, result negative: Secondary | ICD-10-CM | POA: Diagnosis not present

## 2020-08-05 DIAGNOSIS — R8290 Unspecified abnormal findings in urine: Secondary | ICD-10-CM

## 2020-08-05 DIAGNOSIS — R1084 Generalized abdominal pain: Secondary | ICD-10-CM

## 2020-08-05 LAB — POCT URINALYSIS DIP (CLINITEK)
Bilirubin, UA: NEGATIVE
Glucose, UA: NEGATIVE mg/dL
Ketones, POC UA: NEGATIVE mg/dL
Leukocytes, UA: NEGATIVE
Nitrite, UA: NEGATIVE
POC PROTEIN,UA: NEGATIVE
Spec Grav, UA: >=1.03 — AB (ref 1.010–1.025)
Urobilinogen, UA: 0.2 E.U./dL
pH, UA: 5.5 (ref 5.0–8.0)

## 2020-08-05 LAB — POCT URINE PREGNANCY: Preg Test, Ur: NEGATIVE

## 2020-08-05 MED ORDER — NITROFURANTOIN MONOHYD MACRO 100 MG PO CAPS: 100.0000 mg | ORAL_CAPSULE | Freq: Two times a day (BID) | ORAL | 0 refills | Status: AC

## 2020-08-05 NOTE — Progress Notes (Signed)
Pt presents today for stomach pain, stated that on 6/9 presented to UC for UTI, was prescribed Metronidazole for BV pt states stomach has been cramping and feeling like pins sticking in it for approx a month

## 2020-08-05 NOTE — Progress Notes (Signed)
   Judy Quinn, is a 30 y.o. female  GGY:[id number]  OJJ:[id number]  DOB - 25-Jun-1990  Subjective:  Chief Complaint and HPI: Judy Quinn is a 30 y.o. female here today with complaints of dysuria, frequencies and pain to lower abdomen. Recently seen at a local UC and was treated with antibiotics. But her symptoms did not resolve. No diarrhea, LBM las night. Sexually active.  ED/Hospital notes reviewed.   ROS:   Constitutional:  No f/c, No night sweats, No unexplained weight loss. EENT:  No vision changes, No blurry vision, No hearing changes. No mouth, throat, or ear problems.  Respiratory: No cough, No SOB Cardiac: No CP, no palpitations GI:  pain to lower abdomen, No N/V/D. GU: dysuria, frequencies and urgency Neuro: No headache, no dizziness, no motor weakness.  Endocrine:  No polydipsia. No polyuria.  Psych: Denies SI/HI  No problems updated.  ALLERGIES: No Known Allergies  PAST MEDICAL HISTORY: Past Medical History:  Diagnosis Date   Medical history non-contributory     MEDICATIONS AT HOME: Prior to Admission medications   Medication Sig Start Date End Date Taking? Authorizing Provider  Multiple Vitamin (QUINTABS) TABS Take 1 tablet by mouth daily.    [provider]     Objective:  EXAM:   Vitals:   08/05/20 1527  BP: 125/83  Pulse: 89  Resp: 17  Temp: 98.1 F (36.7 C)  SpO2: 100%  Weight: 240 lb 6.4 oz (109 kg)    General appearance : A&OX3. NAD. Non-toxic-appearing Neck: supple, no JVD. No cervical lymphadenopathy. No thyromegaly Chest/Lungs:  Breathing-non-labored, Good air entry bilaterally, breath sounds normal without rales, rhonchi, or wheezing  CVS: S1 S2 regular, no murmurs, gallops, rubs  Abdomen: Bowel sounds present, Non tender and not distended with no gaurding, rigidity or rebound. Tenderness with bladder palpation. GU: deferred Neurology:  CN II-XII grossly intact, Non focal.   Psych:  TP linear. J/I WNL. Normal speech.  Appropriate eye contact and affect.  Data Review No results found for: HGBA1C   Assessment & Plan   1. Generalized abdominal pain - Use tylenol as needed and stay hydrated.  2. Abnormal urinalysis - Urine Culture (Pending)  3. Encounter for pregnancy test, result unknown - POCT urine pregnancy 4. Dysuria - Take Macrobid 100mg  PO BID x7 days. Stay hydrated while taking this medication  5. Morbid obesity (HCC) - Exercise and eat plan base diet. Avoid red meat.     Patient have been counseled extensively about nutrition and exercise  No follow-ups on file.  The patient was given clear instructions to go to ER or return to medical center if symptoms don't improve, worsen or new problems develop. The patient verbalized understanding. The patient was told to call to get lab results if they haven't heard anything in the next week.     , FNP-C [facility] and [facility] [place], [place]   08/05/2020, 4:16 PM

## 2020-08-07 LAB — URINE CULTURE: Organism ID, Bacteria: NO GROWTH

## 2020-08-18 ENCOUNTER — Encounter: Payer: Self-pay | Admitting: Family

## 2020-10-24 ENCOUNTER — Encounter (HOSPITAL_COMMUNITY): Payer: Self-pay

## 2020-10-24 ENCOUNTER — Ambulatory Visit (HOSPITAL_COMMUNITY)
Admission: EM | Admit: 2020-10-24 | Discharge: 2020-10-24 | Disposition: A | Discharge status: Home / Self Care | Payer: Medicaid Other | Attending: Internal Medicine | Admitting: Internal Medicine

## 2020-10-24 ENCOUNTER — Other Ambulatory Visit: Payer: Self-pay

## 2020-10-24 DIAGNOSIS — N760 Acute vaginitis: Secondary | ICD-10-CM | POA: Insufficient documentation

## 2020-10-24 DIAGNOSIS — Z3202 Encounter for pregnancy test, result negative: Secondary | ICD-10-CM

## 2020-10-24 LAB — POCT URINALYSIS DIPSTICK, ED / UC
Bilirubin Urine: NEGATIVE
Glucose, UA: NEGATIVE mg/dL
Ketones, ur: NEGATIVE mg/dL
Leukocytes,Ua: NEGATIVE
Nitrite: NEGATIVE
Protein, ur: NEGATIVE mg/dL
Specific Gravity, Urine: >=1.03 (ref 1.005–1.030)
Urobilinogen, UA: 0.2 mg/dL (ref 0.0–1.0)
pH: 5.5 (ref 5.0–8.0)

## 2020-10-24 LAB — POC URINE PREG, ED: Preg Test, Ur: NEGATIVE

## 2020-10-24 MED ORDER — METRONIDAZOLE 500 MG PO TABS: 500.0000 mg | ORAL_TABLET | Freq: Two times a day (BID) | ORAL | 0 refills | Status: AC

## 2020-10-24 NOTE — ED Provider Notes (Signed)
[facility]    CSN: [id number] Arrival date & time: 10/24/20  1347      History   Chief Complaint Chief Complaint  Patient presents with   Abdominal Pain   Urinary Frequency   Vaginal Discharge    HPI Judy Quinn is a 30 y.o. female presenting with recurrent vaginitis.  Medical history BV.  Describes 4 days of yellow malodorous vaginal discharge, external vaginal irritation, crampy left-sided lower abdominal pain, urinary frequency.  Symptoms similar to typical BV symptoms.  Denies changes in routine.  Does have an OB/GYN follow-up in about 2 weeks. Denies hematuria, dysuria, urgency, back pain, n/v/d/abd pain, fevers/chills, abdnormal vaginal rashes/lesions.    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 Authorizing Provider  metroNIDAZOLE (FLAGYL) 500 MG tablet Take 1 tablet (500 mg total) by mouth 2 (two) times daily. Avoid alcohol while taking this medication and for 2 days after 10/24/20  Yes Rhys Martini, PA-C  Multiple Vitamin (QUINTABS) TABS Take 1 tablet by mouth daily.    [provider]  nitrofurantoin, macrocrystal-monohydrate,  (MACROBID) 100 MG capsule Take 1 capsule (100 mg total) by mouth 2 (two) times daily. 08/05/20   Eleonore Chiquito, FNP    Family History Family History  Problem Relation Age of Onset   Hypertension Mother     Social History Social History   Tobacco Use   Smoking status: Every Day    Types: Cigarettes   Smokeless tobacco: Never  Vaping Use   Vaping Use: Never used  Substance Use Topics   Alcohol use: Yes    Comment: occassional   Drug use: Never     Allergies   Patient has no known allergies.   Review of Systems Review of Systems  Constitutional:  Negative for appetite change, chills, diaphoresis and fever.  Respiratory:  Negative for shortness of breath.   Cardiovascular:  Negative for chest pain.  Gastrointestinal:  Negative for abdominal pain, blood in stool, constipation, diarrhea, nausea and vomiting.  Genitourinary:  Positive for vaginal discharge. Negative for decreased urine volume, difficulty urinating, dysuria, flank pain, frequency, genital sores, hematuria, menstrual problem, pelvic pain, urgency, vaginal bleeding and vaginal pain.  Musculoskeletal:  Negative for back pain.  Neurological:  Negative for dizziness, weakness and light-headedness.  All other systems reviewed and are negative.   Physical Exam Triage Vital Signs ED Triage  Vitals  Enc Vitals Group     BP 10/24/20 1525 126/84     Pulse Rate 10/24/20 1525 99     Resp --      Temp 10/24/20 1525 98.2 F (36.8 C)     Temp src --      SpO2 10/24/20 1525 98 %     Weight --      Height --      Head Circumference --      Peak Flow --      Pain Score 10/24/20 1523 6     Pain Loc --      Pain Edu? --      Excl. in GC? --    No data found.  Updated Vital Signs BP 126/84 (BP Location: Left Arm)   Pulse 99   Temp 98.2 F (36.8 C)   LMP 08/27/2020 (Exact Date)   SpO2 98%   Visual Acuity Right Eye Distance:   Left Eye Distance:   Bilateral Distance:    Right Eye Near:   Left Eye Near:     Bilateral Near:     Physical Exam Vitals reviewed.  Constitutional:      General: She is not in acute distress.    Appearance: Normal appearance. She is not ill-appearing.  HENT:     Head: Normocephalic and atraumatic.     Mouth/Throat:     Mouth: Mucous membranes are moist.     Comments: Moist mucous membranes Eyes:     Extraocular Movements: Extraocular movements intact.     Pupils: Pupils are equal, round, and reactive to light.  Cardiovascular:     Rate and Rhythm: Normal rate and regular rhythm.     Heart sounds: Normal heart sounds.  Pulmonary:     Effort: Pulmonary effort is normal.     Breath sounds: Normal breath sounds. No wheezing, rhonchi or rales.  Abdominal:     General: Bowel sounds are normal. There is no distension.     Palpations: Abdomen is soft. There is no mass.     Tenderness: There is abdominal tenderness in the left lower quadrant. There is no right CVA tenderness, left CVA tenderness, guarding or rebound.     Comments: LLQ tenderness to deep palpation, without guarding or rebound. No mass or hernia.  Skin:    General: Skin is warm.     Capillary Refill: Capillary refill takes less than 2 seconds.     Comments: Good skin turgor  Neurological:     General: No focal deficit present.     Mental Status: She is alert and oriented to person, place, and time.  Psychiatric:        Mood and Affect: Mood normal.        Behavior: Behavior normal.     UC Treatments / Results  Labs (all labs ordered are listed, but only abnormal results are displayed) Labs Reviewed  POCT URINALYSIS DIPSTICK, ED / UC - Abnormal; Notable for the following components:      Result Value   Hgb urine dipstick TRACE (*)    All other components within normal limits  POC URINE PREG, ED  CERVICOVAGINAL ANCILLARY ONLY    EKG   Radiology No results found.  Procedures Procedures (including critical care time)  Medications Ordered in UC Medications - No data to  display  Initial Impression / Assessment and Plan / UC Course  I have reviewed the triage vital signs and the nursing notes.  Pertinent labs &  imaging results that were available during my care of the patient were reviewed by me and considered in my medical decision making (see chart for details).     This patient is a very pleasant 30 y.o. year old female presenting with suspected BV. Afebrile, nontachycardic.  UA with trace blood, otherwise wnl. Did not send culture.  U-preg negative.   Last BV 06/24/20, treated appropriately. Will send self-swab for G/C, trich, yeast, BV testing. Declines HIV, RPR. Safe sex precautions.   Flagyl sent.  F/u with OB-GYN in 2 weeks as scheduled.  ED return precautions discussed. Patient verbalizes understanding and agreement.   Coding Level 4 for review of past notes/labs, order and interpretation of labs today, and prescription drug management  Final Clinical Impressions(s) / UC Diagnoses   Final diagnoses:  Vaginitis and vulvovaginitis  Negative pregnancy test  Recurrent vaginitis     Discharge Instructions      -For suspected bacterial vaginosis, start the antibiotic-Flagyl (metronidazole), 2 pills daily for 7 days.  You can take this with food if you have a sensitive stomach.  Avoid alcohol while taking this medication and for 2 days after as this will cause severe nausea and vomiting. -Follow-up with your OB-GYN as scheduled in 2 weeks.  -We'll call in about 2 days if anything else is positive and could send additional treatment at that time.      ED Prescriptions     Medication Sig Dispense Auth. Provider   metroNIDAZOLE (FLAGYL) 500 MG tablet Take 1 tablet (500 mg total) by mouth 2 (two) times daily. Avoid alcohol while taking this medication and for 2 days after 14 tablet Rhys Martini, PA-C      PDMP not reviewed this encounter.   Rhys Martini, PA-C 10/24/20 1658

## 2020-10-24 NOTE — ED Triage Notes (Signed)
Patient here for abd pain x 4 days- right side and radiated to pelvic area.  Frequent urination x 4 days. Odor.  Vaginal discharge noted as well.

## 2020-10-24 NOTE — Discharge Instructions (Addendum)
-  For suspected bacterial vaginosis, start the antibiotic-Flagyl (metronidazole), 2 pills daily for 7 days.  You can take this with food if you have a sensitive stomach.  Avoid alcohol while taking this medication and for 2 days after as this will cause severe nausea and vomiting. -Follow-up with your OB-GYN as scheduled in 2 weeks.  -We'll call in about 2 days if anything else is positive and could send additional treatment at that time.

## 2020-10-25 LAB — CERVICOVAGINAL ANCILLARY ONLY
Bacterial Vaginitis (gardnerella): POSITIVE — AB
Candida Glabrata: NEGATIVE
Candida Vaginitis: POSITIVE — AB
Chlamydia: NEGATIVE
Comment: NEGATIVE
Comment: NEGATIVE
Comment: NEGATIVE
Comment: NEGATIVE
Comment: NEGATIVE
Comment: NORMAL
Neisseria Gonorrhea: NEGATIVE
Trichomonas: NEGATIVE

## 2020-10-26 ENCOUNTER — Telehealth (HOSPITAL_COMMUNITY): Payer: Self-pay | Admitting: Emergency Medicine

## 2020-10-26 MED ORDER — FLUCONAZOLE 150 MG PO TABS: 150.0000 mg | ORAL_TABLET | Freq: Once | ORAL | 0 refills | Status: AC

## 2020-11-29 ENCOUNTER — Encounter: Payer: Self-pay | Admitting: Family

## 2020-12-03 IMAGING — RF DG UGI W SINGLE CM
1 series · 15 of 24 positions shown · non-contrast
Comparison: None.

CLINICAL DATA: Preop bariatric surgery.

EXAM:
UPPER GI SERIES WITH KUB
TECHNIQUE: After obtaining a scout radiograph a routine upper GI series was
performed using thin barium.
FLUOROSCOPY TIME:  Fluoroscopy Time:  1 minutes 18 seconds.
Radiation Exposure Index (if provided by the fluoroscopic device):
504 mGy
Number of Acquired Spot Images: 6

[Series 1: one shot · 0.14mm/px · 15 of 26 slices shown]
[im 1/26]
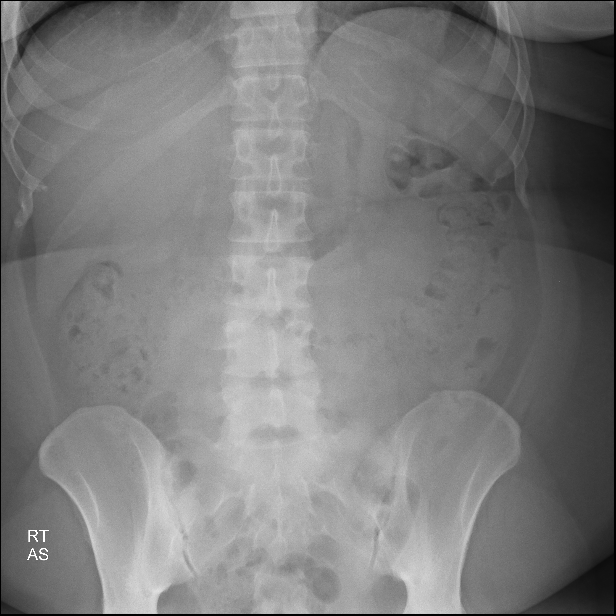
[im 3/26]
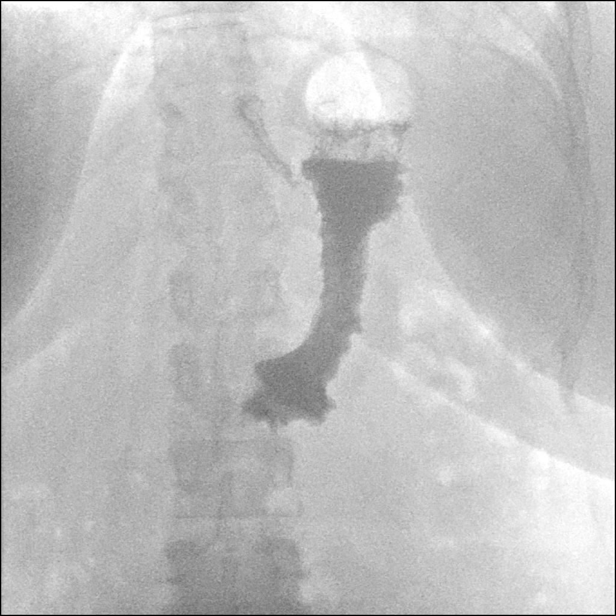
[im 5/26]
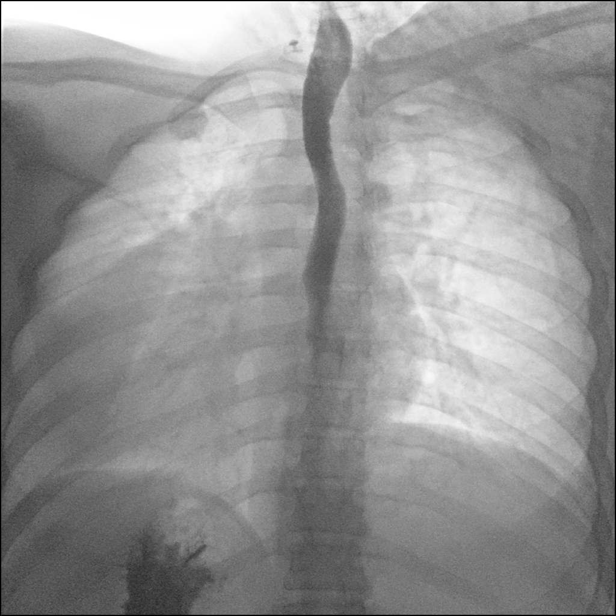
[im 6/26]
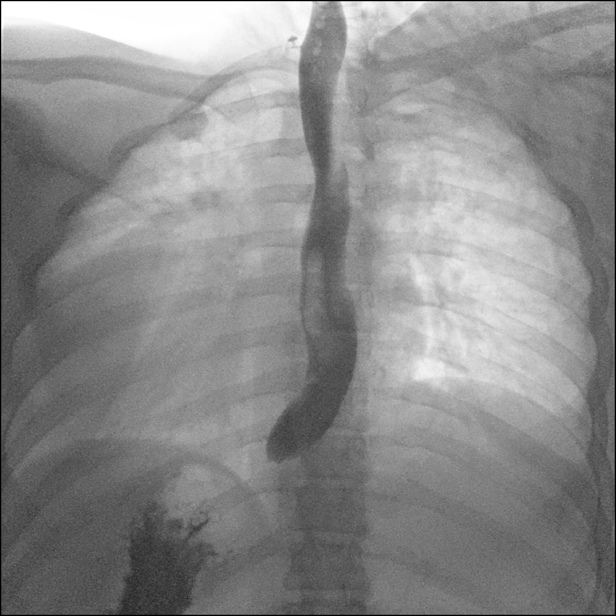
[im 8/26]
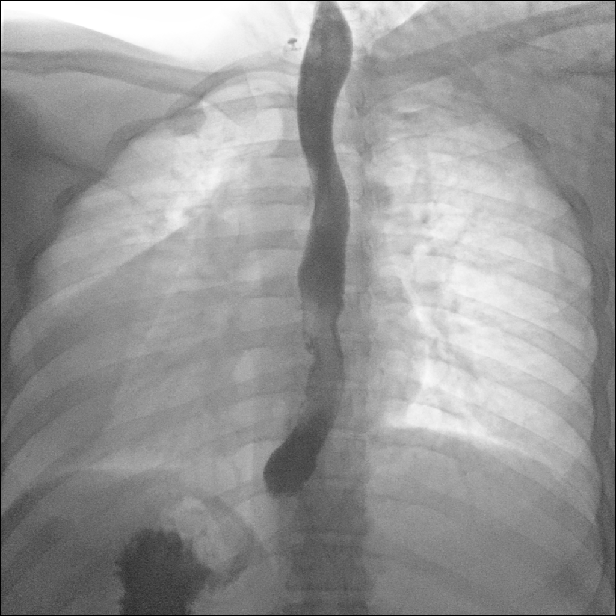
[im 9/26]
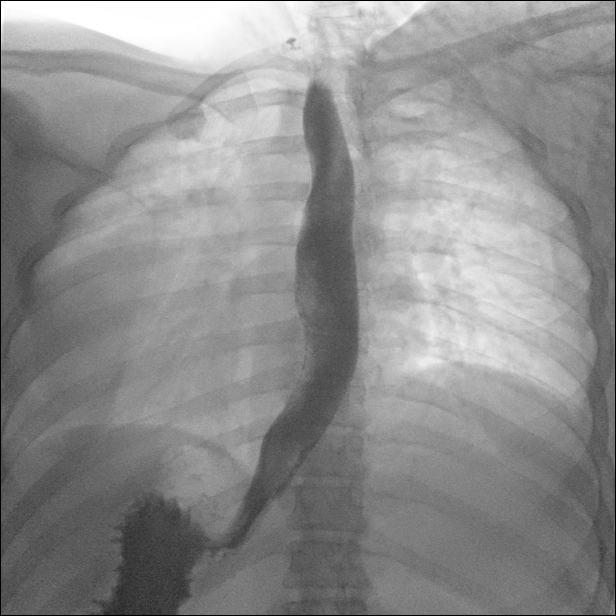
[im 11/26]
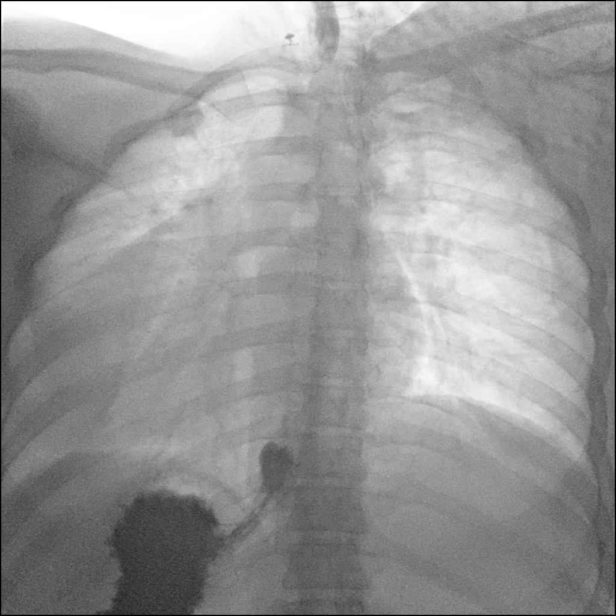
[im 14/26]
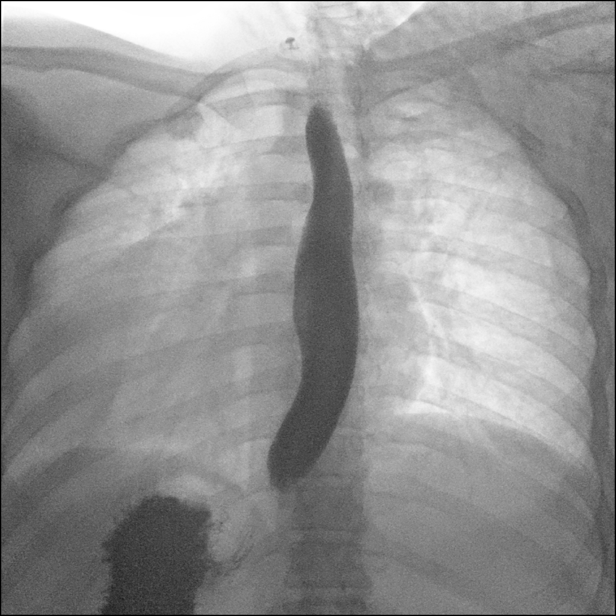
[im 15/26]
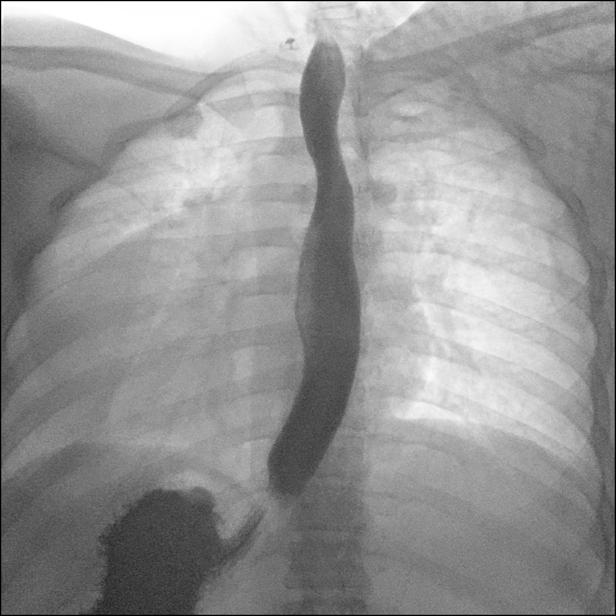
[im 17/26]
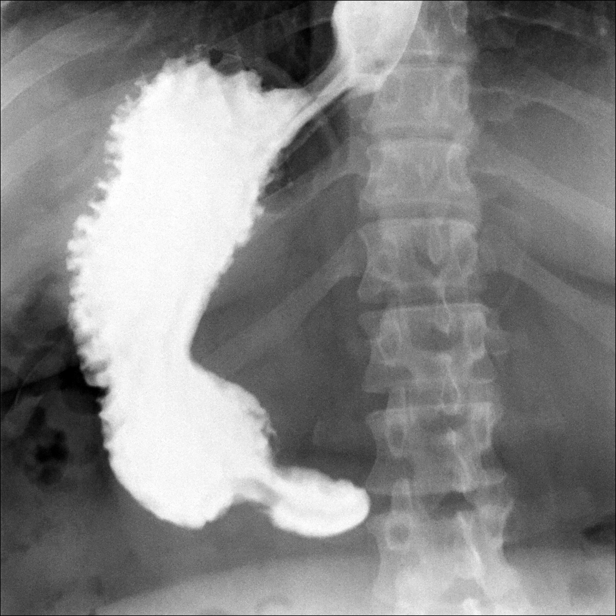
[im 18/26]
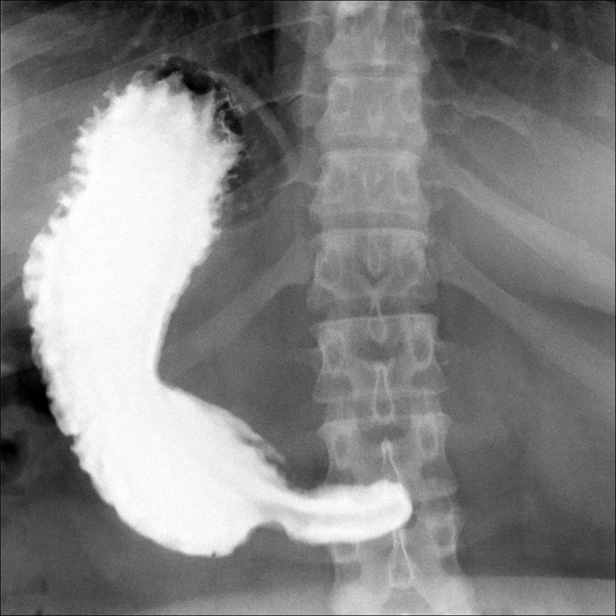
[im 20/26]
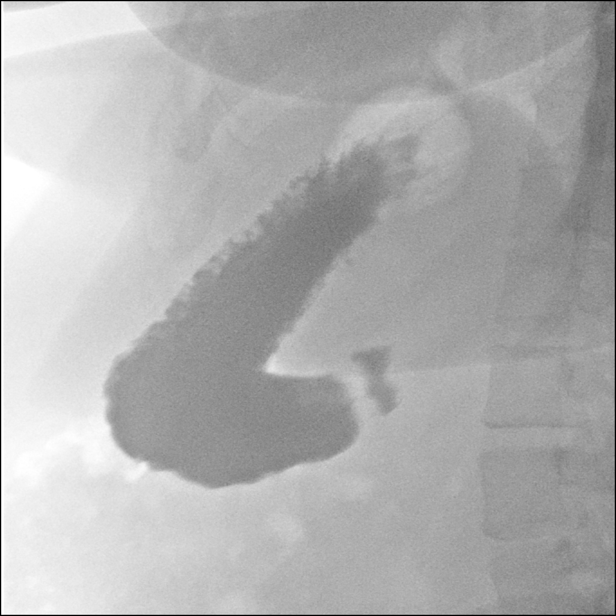
[im 22/26]
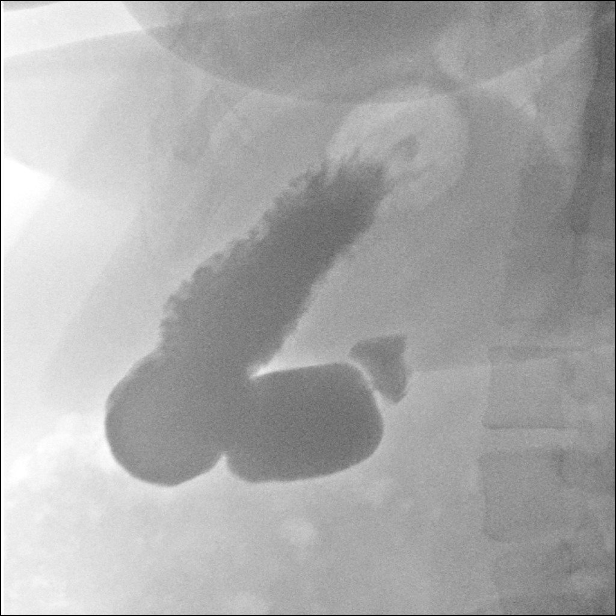
[im 23/26]
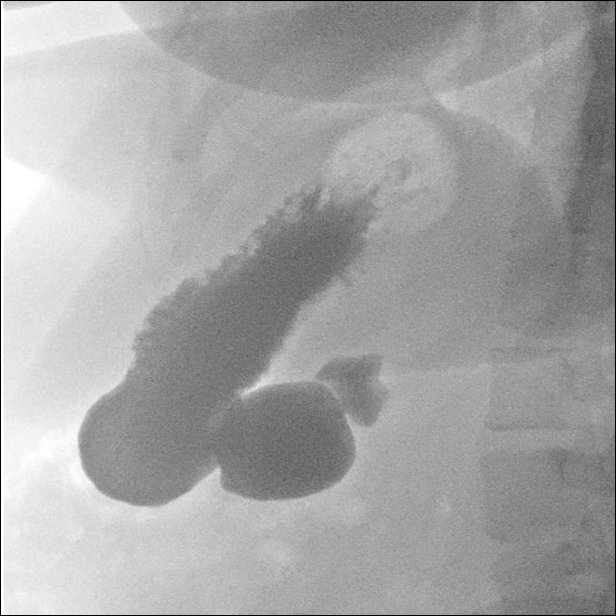
[im 26/26]
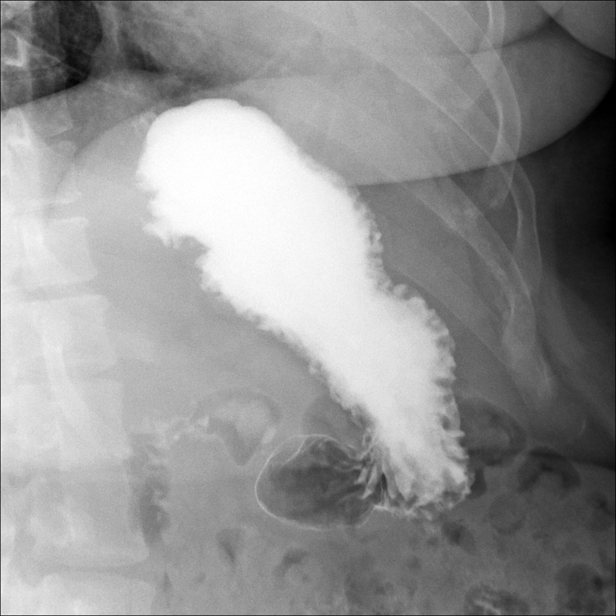

[15 of 24 positions shown; findings below may reference images not displayed]

FINDINGS: Scout view of the abdomen shows a fair amount of stool in the colon.
Bowel gas pattern is otherwise unremarkable. No unexpected
radiopaque calculi.

Normal esophageal motility. No esophageal fold thickening, stricture
or obstruction. No hiatal hernia. Stomach and duodenal bulb are
normal.
IMPRESSION: 1. Normal study.
2. Fair amount of stool in the colon is indicative of constipation.

## 2020-12-28 DIAGNOSIS — L700 Acne vulgaris: Secondary | ICD-10-CM | POA: Diagnosis not present

## 2021-03-28 DIAGNOSIS — L700 Acne vulgaris: Secondary | ICD-10-CM | POA: Diagnosis not present
